# Patient Record
Sex: Male | Born: 1965 | Race: Black or African American | Hispanic: No | Marital: Married | Smoking: Never smoker
Health system: Southern US, Community
[De-identification: ages and names within clinical notes are randomized; demographics above are authoritative.]

## PROBLEM LIST (undated history)

## (undated) DIAGNOSIS — R7303 Prediabetes: Secondary | ICD-10-CM

## (undated) DIAGNOSIS — I10 Essential (primary) hypertension: Secondary | ICD-10-CM

## (undated) DIAGNOSIS — M1990 Unspecified osteoarthritis, unspecified site: Secondary | ICD-10-CM

---

## 2004-08-14 ENCOUNTER — Ambulatory Visit (HOSPITAL_COMMUNITY): Admission: RE | Admit: 2004-08-14 | Discharge: 2004-08-14 | Payer: Self-pay | Admitting: Gastroenterology

## 2010-12-07 ENCOUNTER — Encounter
Admission: RE | Admit: 2010-12-07 | Discharge: 2010-12-07 | Payer: Self-pay | Source: Home / Self Care | Attending: Family Medicine | Admitting: Family Medicine

## 2011-05-14 NOTE — Op Note (Signed)
NAME:  Daryl Chen, Daryl Chen                         ACCOUNT NO.:  [id number]   MEDICAL RECORD NO.:  [id number]                   PATIENT TYPE:  AMB   LOCATION:  ENDO                                 FACILITY:  [facility]   PHYSICIAN:  Graylin Shiver, M.D.                DATE OF BIRTH:  04/12/1966   DATE OF PROCEDURE:  08/14/2004  DATE OF DISCHARGE:                                 OPERATIVE REPORT   PROCEDURE:  Colonoscopy.   INDICATIONS FOR PROCEDURE:  Rectal bleeding.   CONSENT:  Informed consent was obtained after explanation of the risks of  bleeding, infection, and perforation.   PREMEDICATION:  Fentanyl 100 mcg IV, Versed 12.5 mg IV.   PROCEDURE IN DETAIL:  With the patient in the left lateral decubitus  position, a rectal exam was performed and no masses were felt.  The Olympus  colonoscope was inserted into the rectum and advanced around the colon to  the cecum.  Cecal landmarks were identified.  The cecum and ascending colon  were normal.  The transverse colon was normal.  The descending colon,  sigmoid, and rectum were normal.  The scope was retroflexed in the rectum,  no abnormalities wejre seen.  The scope was straightened and brought out.  He tolerated the procedure well without complications.   IMPRESSION:  Normal colonoscopy to the cecum.                                               Graylin Shiver, M.D.    Germain Osgood  D:  08/14/2004  T:  08/15/2004  Job:  [id number]   cc:   Annabell Howells, MD  Fax: [phone]

## 2013-10-08 ENCOUNTER — Ambulatory Visit: Payer: 59 | Admitting: Emergency Medicine

## 2013-10-08 VITALS — BP 126/78 | HR 80 | Temp 99.9°F | Resp 18 | Ht 66.6 in | Wt 189.8 lb

## 2013-10-08 DIAGNOSIS — Z202 Contact with and (suspected) exposure to infections with a predominantly sexual mode of transmission: Secondary | ICD-10-CM

## 2013-10-08 DIAGNOSIS — Z9189 Other specified personal risk factors, not elsewhere classified: Secondary | ICD-10-CM

## 2013-10-08 MED ORDER — VALACYCLOVIR HCL 1 G PO TABS: 1000.0000 mg | ORAL_TABLET | Freq: Two times a day (BID) | ORAL | Status: DC

## 2013-10-08 NOTE — Patient Instructions (Signed)
Genital Herpes  Genital herpes is a sexually transmitted disease. This means that it is a disease passed by having sex with an infected person. There is no cure for genital herpes. The time between attacks can be months to years. The virus may live in a person but produce no problems (symptoms). This infection can be passed to a baby as it travels down the birth canal (vagina). In a newborn, this can cause central nervous system damage, eye damage, or even death. The virus that causes genital herpes is usually HSV-2 virus. The virus that causes oral herpes is usually HSV-1. The diagnosis (learning what is wrong) is made through culture results.  SYMPTOMS   Usually symptoms of pain and itching begin a few days to a week after contact. It first appears as small blisters that progress to small painful ulcers which then scab over and heal after several days. It affects the outer genitalia, birth canal, cervix, penis, anal area, buttocks, and thighs.  HOME CARE INSTRUCTIONS   · Keep ulcerated areas dry and clean.  · Take medications as directed. Antiviral medications can speed up healing. They will not prevent recurrences or cure this infection. These medications can also be taken for suppression if there are frequent recurrences.  · While the infection is active, it is contagious. Avoid all sexual contact during active infections.  · Condoms may help prevent spread of the herpes virus.  · Practice safe sex.  · Wash your hands thoroughly after touching the genital area.  · Avoid touching your eyes after touching your genital area.  · Inform your caregiver if you have had genital herpes and become pregnant. It is your responsibility to insure a safe outcome for your baby in this pregnancy.  · Only take over-the-counter or prescription medicines for pain, discomfort, or fever as directed by your caregiver.  SEEK MEDICAL CARE IF:   · You have a recurrence of this infection.  · You do not respond to medications and are not  improving.  · You have new sources of pain or discharge which have changed from the original infection.  · You have an oral temperature above 102° F (38.9° C).  · You develop abdominal pain.  · You develop eye pain or signs of eye infection.  Document Released: 12/10/2000 Document Revised: 03/06/2012 Document Reviewed: 12/31/2009  ExitCare® Patient Information ©2014 [employer].

## 2013-10-09 NOTE — Progress Notes (Signed)
[facility] [address], [place] [address] [phone]  Date:  10/08/2013   Name:  Daryl Chen   DOB:  09-Apr-1966   MRN:  [id number]  PCP:  No primary provider on file.    Chief Complaint: Exposure to STD   History of Present Illness:  Daryl Chen is a 47 y.o. 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  He is asymptomatic and has been in what he presumed was a monogamous relationship for 5 years.  He has no history of STD.  No improvement with over the counter medications or other home remedies. Denies other complaint or health concern today.   There are no active problems to display for this patient.   No past medical history on file.  No past surgical history on file.  History  Substance Use Topics  . Smoking status: Never Smoker   . Smokeless tobacco: Never Used  . Alcohol Use: Yes     Comment:  heavy        No family history on file.  No Known Allergies  Medication list has been reviewed and updated.  No current outpatient prescriptions on file prior to visit.   No current facility-administered medications on file prior to visit.    Review of Systems:  As per HPI, otherwise negative.    Physical Examination: Filed Vitals:   10/08/13 1810  BP: 126/78  Pulse: 80  Temp: 99.9 F (37.7 C)  Resp: 18   Filed Vitals:   10/08/13 1810  Height: 5' 6.6" (1.692 m)  Weight: 189 lb 12.8 oz (86.093 kg)   Body mass index is 30.07 kg/(m^2). Ideal Body Weight: Weight in (lb) to have BMI = 25: 157.4   GEN: WDWN, NAD, Non-toxic, Alert & Oriented x 3 HEENT: Atraumatic, Normocephalic.  Ears and Nose: No external deformity. EXTR: No clubbing/cyanosis/edema NEURO: Normal gait.  PSYCH: Normally interactive. Conversant. Not depressed or anxious appearing.  Calm demeanor.    Assessment and Plan: Exposure to STD Counseled regarding  STD Offered RX with valtrex    Signed,  Phillips Odor, MD

## 2015-12-05 ENCOUNTER — Emergency Department (HOSPITAL_COMMUNITY)
Admission: EM | Admit: 2015-12-05 | Discharge: 2015-12-05 | Disposition: A | Discharge status: Home / Self Care | Payer: 59 | Attending: Emergency Medicine | Admitting: Emergency Medicine

## 2015-12-05 ENCOUNTER — Encounter (HOSPITAL_COMMUNITY): Payer: Self-pay | Admitting: Emergency Medicine

## 2015-12-05 ENCOUNTER — Emergency Department (HOSPITAL_COMMUNITY): Payer: 59

## 2015-12-05 DIAGNOSIS — M4802 Spinal stenosis, cervical region: Secondary | ICD-10-CM | POA: Diagnosis not present

## 2015-12-05 DIAGNOSIS — I10 Essential (primary) hypertension: Secondary | ICD-10-CM | POA: Diagnosis not present

## 2015-12-05 DIAGNOSIS — Z79899 Other long term (current) drug therapy: Secondary | ICD-10-CM | POA: Diagnosis not present

## 2015-12-05 DIAGNOSIS — R200 Anesthesia of skin: Secondary | ICD-10-CM | POA: Diagnosis not present

## 2015-12-05 DIAGNOSIS — G629 Polyneuropathy, unspecified: Secondary | ICD-10-CM | POA: Insufficient documentation

## 2015-12-05 DIAGNOSIS — R29898 Other symptoms and signs involving the musculoskeletal system: Secondary | ICD-10-CM | POA: Diagnosis not present

## 2015-12-05 DIAGNOSIS — G542 Cervical root disorders, not elsewhere classified: Secondary | ICD-10-CM

## 2015-12-05 DIAGNOSIS — R202 Paresthesia of skin: Secondary | ICD-10-CM | POA: Diagnosis present

## 2015-12-05 HISTORY — DX: Essential (primary) hypertension: I10

## 2015-12-05 LAB — DIFFERENTIAL
BASOS PCT: 0 %
Basophils Absolute: 0 10*3/uL (ref 0.0–0.1)
EOS PCT: 0 %
Eosinophils Absolute: 0 10*3/uL (ref 0.0–0.7)
LYMPHS PCT: 31 %
Lymphs Abs: 1.3 10*3/uL (ref 0.7–4.0)
MONO ABS: 0.3 10*3/uL (ref 0.1–1.0)
Monocytes Relative: 8 %
NEUTROS PCT: 61 %
Neutro Abs: 2.5 10*3/uL (ref 1.7–7.7)

## 2015-12-05 LAB — CBC
HCT: 43.2 % (ref 39.0–52.0)
Hemoglobin: 14.6 g/dL (ref 13.0–17.0)
MCH: 30.2 pg (ref 26.0–34.0)
MCHC: 33.8 g/dL (ref 30.0–36.0)
MCV: 89.3 fL (ref 78.0–100.0)
PLATELETS: 203 10*3/uL (ref 150–400)
RBC: 4.84 MIL/uL (ref 4.22–5.81)
RDW: 12.4 % (ref 11.5–15.5)
WBC: 4.2 10*3/uL (ref 4.0–10.5)

## 2015-12-05 LAB — I-STAT CHEM 8, ED
BUN: 10 mg/dL (ref 6–20)
CALCIUM ION: 1.16 mmol/L (ref 1.12–1.23)
CHLORIDE: 105 mmol/L (ref 101–111)
Creatinine, Ser: 1 mg/dL (ref 0.61–1.24)
Glucose, Bld: 148 mg/dL — ABNORMAL HIGH (ref 65–99)
HCT: 49 % (ref 39.0–52.0)
Hemoglobin: 16.7 g/dL (ref 13.0–17.0)
POTASSIUM: 4.9 mmol/L (ref 3.5–5.1)
SODIUM: 142 mmol/L (ref 135–145)
TCO2: 27 mmol/L (ref 0–100)

## 2015-12-05 LAB — I-STAT TROPONIN, ED: Troponin i, poc: 0 ng/mL (ref 0.00–0.08)

## 2015-12-05 LAB — COMPREHENSIVE METABOLIC PANEL
ALBUMIN: 4.5 g/dL (ref 3.5–5.0)
ALK PHOS: 49 U/L (ref 38–126)
ALT: 22 U/L (ref 17–63)
ANION GAP: 10 (ref 5–15)
AST: 30 U/L (ref 15–41)
BUN: 8 mg/dL (ref 6–20)
CALCIUM: 9.6 mg/dL (ref 8.9–10.3)
CHLORIDE: 105 mmol/L (ref 101–111)
CO2: 25 mmol/L (ref 22–32)
CREATININE: 1.03 mg/dL (ref 0.61–1.24)
GFR calc non Af Amer: >60 mL/min (ref >60)
GLUCOSE: 143 mg/dL — AB (ref 65–99)
Potassium: 4.3 mmol/L (ref 3.5–5.1)
SODIUM: 140 mmol/L (ref 135–145)
Total Bilirubin: 0.8 mg/dL (ref 0.3–1.2)
Total Protein: 8.3 g/dL — ABNORMAL HIGH (ref 6.5–8.1)

## 2015-12-05 LAB — ETHANOL

## 2015-12-05 LAB — FOLATE: FOLATE: 9.8 ng/mL (ref >5.9)

## 2015-12-05 LAB — PROTIME-INR
INR: 1.07 (ref 0.00–1.49)
PROTHROMBIN TIME: 14.1 s (ref 11.6–15.2)

## 2015-12-05 LAB — APTT: aPTT: 31 seconds (ref 24–37)

## 2015-12-05 LAB — VITAMIN B12: Vitamin B-12: 412 pg/mL (ref 180–914)

## 2015-12-05 MED ORDER — PREDNISONE 20 MG PO TABS
60.0000 mg | ORAL_TABLET | Freq: Once | ORAL | Status: AC
  Administered 2015-12-05: 60 mg via ORAL
  Filled 2015-12-05: qty 3

## 2015-12-05 MED ORDER — METHYLPREDNISOLONE 4 MG PO TBPK: ORAL_TABLET | ORAL | Status: DC

## 2015-12-05 NOTE — ED Notes (Signed)
Patient transported to MRI 

## 2015-12-05 NOTE — ED Provider Notes (Signed)
CSN: [id number]     Arrival date & time 12/05/15  1629 History   First MD Initiated Contact with Patient 12/05/15 1731     Chief Complaint  Patient presents with  . Stroke Symptoms   HPI Pt has had trouble over 2-3 months now.  He noticed difficulty moving his index and thumb on the left side.  He has trouble extending his left index finger and thumb.  It feels numb and tingling.  He has now noticed trouble with sx moving to the right arm.    No trouble with speech or weakness or numbness in the legs.  No vision issues. He called his doctor and was told to come to the ED.  Past Medical History  Diagnosis Date  . Hypertension   He is supposed to be on medications. History reviewed. No pertinent past surgical history. No family history on file. Social History  Substance Use Topics  . Smoking status: Never Smoker   . Smokeless tobacco: Never Used  . Alcohol Use: Yes     Comment:  heavy        Review of Systems  All other systems reviewed and are negative.     Allergies  Review of patient's allergies indicates no known allergies.  Home Medications   Prior to Admission medications   Medication Sig Start Date End Date Taking? Authorizing Provider  valACYclovir (VALTREX) 1000 MG tablet Take 1 tablet (1,000 mg total) by mouth 2 (two) times daily. 10/08/13   Carmelina Dane, MD   BP 181/102 mmHg  Pulse 74  Temp(Src) 98.2 F (36.8 C) (Oral)  Resp 18  SpO2 100% Physical Exam  Constitutional: He is oriented to person, place, and time. He appears well-developed and well-nourished. No distress.  HENT:  Head: Normocephalic and atraumatic.  Right Ear: External ear normal.  Left Ear: External ear normal.  Mouth/Throat: Oropharynx is clear and moist.  Eyes: Conjunctivae are normal. Right eye exhibits no discharge. Left eye exhibits no discharge. No scleral icterus.  Neck: Neck supple. No tracheal deviation present.  Cardiovascular: Normal rate, regular rhythm and intact distal  pulses.   Pulmonary/Chest: Effort normal and breath sounds normal. No stridor. No respiratory distress. He has no wheezes. He has no rales.  Abdominal: Soft. Bowel sounds are normal. He exhibits no distension. There is no tenderness. There is no rebound and no guarding.  Musculoskeletal: He exhibits no edema or tenderness.  Neurological: He is alert and oriented to person, place, and time. He has normal strength. No cranial nerve deficit (No facial droop, extraocular movements intact, tongue midline ) or sensory deficit. He exhibits normal muscle tone. He displays no seizure activity. Coordination normal.  No pronator drift bilateral upper extrem, able to hold both legs off bed for 5 seconds, sensation intact in all extremities, no visual field cuts, no left or right sided neglect, normal finger-nose exam bilaterally, no nystagmus noted  Unable  extending thumb and index finger on left, atrophy noted in forearm and  Possibly hand, tremor noted in bilateral lower extremity when lifting off the bed   Skin: Skin is warm and dry. No rash noted.  Psychiatric: He has a normal mood and affect.  Nursing note and vitals reviewed.   ED Course  Procedures (including critical care time) Labs Review Labs Reviewed  I-STAT CHEM 8, ED - Abnormal; Notable for the following:    Glucose, Bld 148 (*)    All other components within normal limits  PROTIME-INR  APTT  CBC  DIFFERENTIAL  COMPREHENSIVE METABOLIC PANEL  I-STAT TROPOININ, ED  CBG MONITORING, ED    Imaging Review Mr Brain Wo Contrast  12/05/2015  CLINICAL DATA:  49 year old male with 3 month history of progressive weakness and paresthesias. Lower extremity tremor. Initial encounter. EXAM: MRI HEAD WITHOUT CONTRAST TECHNIQUE: Multiplanar, multiecho pulse sequences of the brain and surrounding structures were obtained without intravenous contrast. COMPARISON:  Cervical spine MRI from today reported separately. FINDINGS: Cerebral volume is normal.  No restricted diffusion to suggest acute infarction. No midline shift, mass effect, evidence of mass lesion, ventriculomegaly, extra-axial collection or acute intracranial hemorrhage. Cervicomedullary junction and pituitary are within normal limits. Major intracranial vascular flow voids are within normal limits, dominant right vertebral artery re- demonstrated. Largely normal for age gray and white matter signal throughout the brain. There is a solitary area of chronic micro hemorrhage in the right parietal lobe on series 7, image 61). No encephalomalacia. No other chronic cerebral blood products. Visible internal auditory structures appear normal. Mastoids are clear. Paranasal sinuses are clear. Negative orbit and scalp soft tissues. Normal bone marrow signal. Cervical spine findings today reported separately. IMPRESSION: No acute intracranial abnormality and largely unremarkable for age noncontrast MRI appearance of the brain. Electronically Signed   By: Odessa Fleming M.D.   On: 12/05/2015 21:07   Mr Cervical Spine Wo Contrast  12/05/2015  CLINICAL DATA:  49 year old male with 3 months probe aggressive weakness and paresthesia right index finger and thumb. Weakness and atrophy of left upper extremity. Initial encounter. EXAM: MRI CERVICAL SPINE WITHOUT CONTRAST TECHNIQUE: Multiplanar, multisequence MR imaging of the cervical spine was performed. No intravenous contrast was administered. COMPARISON:  None. FINDINGS: Study is intermittently degraded by motion artifact despite repeated imaging attempts. Straightening of cervical lordosis. Chronic degenerative endplate changes with marrow changes at C6-C7. No marrow edema or evidence of acute osseous abnormality. Cervicomedullary junction is within normal limits. Grossly negative visualized brain parenchyma. Despite multilevel spinal stenosis, no spinal cord signal abnormality is identified. Dominant right vertebral artery. C2-C3:  Negative. C3-C4: Uncovertebral  hypertrophy greater on the right. Mild to moderate right C4 foraminal stenosis. C4-C5: Mild circumferential disc bulge. Broad-based posterior component of disc. No significant spinal stenosis. Disc and uncovertebral hypertrophy resulting in mild to moderate bilateral C5 foraminal stenosis. C5-C6: Circumferential disc bulge plus superimposed central disc protrusion (Series 7, image 17). Spinal stenosis with ventral spinal cord mass effect. No cord signal abnormality. Mild if any left C6 foraminal stenosis. C6-C7: Disc space loss with circumferential disc osteophyte complex. Broad-based left eccentric posterior component of disc best seen on series 6, image 25. Mild ligament flavum hypertrophy. Borderline to mild spinal stenosis with no cord mass effect. Uncovertebral hypertrophy with severe left greater than right C7 foraminal stenosis. C7-T1: Circumferential disc osteophyte complex with broad-based posterior component of disc best seen on series 6, image 30. Mild ligament flavum hypertrophy. Mild spinal stenosis with mild if any cord mass effect. Disc and uncovertebral hypertrophy with severe bilateral C8 foraminal stenosis. No upper thoracic spinal stenosis identified. There is a small T2-T3 disc protrusion. IMPRESSION: 1. Degenerative spinal stenosis with up to mild spinal cord mass effect at C5-C6, C6-C7, and C7-T1. No spinal cord signal abnormality. 2. Degenerative severe neural foraminal stenosis at the bilateral C7 (greater on the left) and C8 nerve levels. Electronically Signed   By: Odessa Fleming M.D.   On: 12/05/2015 20:44       MDM   Final diagnoses:  Cervical neuropathy  Spinal stenosis  of cervical region  Foraminal stenosis of cervical region    1826  Discussed with Dr Amada JupiterKirkpatrick.  Will proceed with MRI of brain and c spine  Reviewed findings with Dr Hosie PoissonSumner.  MRI of c spine correlates with his exam.  Will consult with neurosurgery.  I discussed the case with Dr Jordan LikesPool.  Pt will likely end up  needing surgery but not emergently.  Start on steroid dose pack.  Follow up in his office.  DIscussed findings with patient and importance of follow up plan   Linwood DibblesJon Galen Russman, MD 12/05/15 2327

## 2015-12-05 NOTE — Discharge Instructions (Signed)

## 2015-12-05 NOTE — ED Notes (Signed)
Per pt, he lost the ability to move pointer and thumb fingers on his L hand,  Pt c/o numbness to left side of his face, and tingling to L arm. Pt states hes had these symptoms for two months and never went ot go see a doctor. Pt is ambulatory with steady gait. Grip strength is equal, no drift and face is symmetrical. Denies pain at this time.

## 2015-12-05 NOTE — Consult Note (Signed)
Consult Reason for Consult:weakness and numbness Referring Physician: Dr Lynelle DoctorKnapp ED  CC: weakness and numbness  HPI: Daryl Chen is an [age] y.o. male with hx of HTN presenting with a 3 month history of progressive weakness and paresthesias of his right index finger and thumb. He also describes a paresthesia type sensation radiating up his right arm. Recently has also noted similar symptoms starting on the LUE. He notes atrophy going up his right forearm. Denies any weakness or numbness in his lower extremities though he does report that his legs "shake" when he tries to use them. Denies any falls, no trauma to head or neck. No change in bowel/bladder. No family history of neurodegenerative or muscle disorders.   Past Medical History  Diagnosis Date  . Hypertension     History reviewed. No pertinent past surgical history.  No family history on file.  Social History:  reports that he has never smoked. He has never used smokeless tobacco. He reports that he drinks alcohol. He reports that he does not use illicit drugs.  No Known Allergies  Medications: I have reviewed the patient's current medications.   ROS: Out of a complete 14 system review, the patient complains of only the following symptoms, and all other reviewed systems are negative. +weakness, atrophy, numbness  Physical Examination: Filed Vitals:   12/05/15 1915 12/05/15 1930  BP: 135/90 131/83  Pulse: 63 64  Temp:    Resp: 20 21   Physical Exam  Constitutional: He appears well-developed and well-nourished.  Psych: Affect appropriate to situation Eyes: No scleral injection HENT: No OP obstrucion Head: Normocephalic.  Cardiovascular: Normal rate and regular rhythm.  Respiratory: Effort normal and breath sounds normal.  GI: Soft. Bowel sounds are normal. No distension. There is no tenderness.  Skin: WDI  Neurologic Examination Mental Status: Alert, oriented, thought content appropriate.  Speech fluent  without evidence of aphasia.  Able to follow 3 step commands without difficulty. Cranial Nerves: II: funduscopic exam wnl bilaterally, visual fields grossly normal, pupils equal, round, reactive to light and accommodation III,IV, VI: ptosis not present, extra-ocular motions intact bilaterally V,VII: smile symmetric, facial light touch sensation normal bilaterally VIII: hearing normal bilaterally IX,X: gag reflex present XI: trapezius strength/neck flexion strength normal bilaterally XII: tongue strength normal  Motor: LUE and LLE 5/5 strength Proximal and Distal RUE 5/5 strength Proximal LUE 5/5, biceps 5/5, triceps 5/5, brachioradialis 5/5, wrist ext/flex 5/5, unable to extend thumb or index finger. Weak thumb opposition on left side.  Atrophy noted in right forearm. Spastic tremor type movement of bilateral LE when attempting to lift feet off the floor Sensory: diminished LT and PP on palmar surface of thumb and index finger on the left Deep Tendon Reflexes: *+ and symmetric throughout Plantars: Right: downgoing   Left: downgoing Cerebellar: normal finger-to-nose,  and normal heel-to-shin test Gait: deferred  Laboratory Studies:   Basic Metabolic Panel:  Recent Labs Lab 12/05/15 1659 12/05/15 1744  NA 142 140  K 4.9 4.3  CL 105 105  CO2  --  25  GLUCOSE 148* 143*  BUN 10 8  CREATININE 1.00 1.03  CALCIUM  --  9.6    Liver Function Tests:  Recent Labs Lab 12/05/15 1744  AST 30  ALT 22  ALKPHOS 49  BILITOT 0.8  PROT 8.3*  ALBUMIN 4.5   No results for input(s): LIPASE, AMYLASE in the last 168 hours. No results for input(s): AMMONIA in the last 168 hours.  CBC:  Recent Labs Lab  12/05/15 1659 12/05/15 1744  WBC  --  4.2  NEUTROABS  --  2.5  HGB 16.7 14.6  HCT 49.0 43.2  MCV  --  89.3  PLT  --  203    Cardiac Enzymes: No results for input(s): CKTOTAL, CKMB, CKMBINDEX, TROPONINI in the last 168 hours.  BNP: Invalid input(s): POCBNP  CBG: No results  for input(s): GLUCAP in the last 168 hours.  Microbiology: No results found for this or any previous visit.  Coagulation Studies:  Recent Labs  12/05/15 1744  LABPROT 14.1  INR 1.07    Urinalysis: No results for input(s): COLORURINE, LABSPEC, PHURINE, GLUCOSEU, HGBUR, BILIRUBINUR, KETONESUR, PROTEINUR, UROBILINOGEN, NITRITE, LEUKOCYTESUR in the last 168 hours.  Invalid input(s): APPERANCEUR  Lipid Panel:  No results found for: CHOL, TRIG, HDL, CHOLHDL, VLDL, LDLCALC  HgbA1C: No results found for: HGBA1C  Urine Drug Screen:  No results found for: LABOPIA, COCAINSCRNUR, LABBENZ, AMPHETMU, THCU, LABBARB  Alcohol Level:  Recent Labs Lab 12/05/15 1748  ETH <5    Other results:  Imaging: No results found.   Assessment/Plan:  49y/o gentleman with history of hypertension presenting with 3 month history of progressive weakness and sensory changes involving the LUE distal > proximal. Symptoms now starting to involve his RUE. Differential includes a myelopathy vs myopathy vs peripheral neuropathy.  -check MRI brain and C spine -if unremarkable will need outpatient neurology follow up for an EMG/NCS   Elspeth Cho, DO [facility] [phone]  If 7pm- 7am, please page neurology on call as listed in AMION. 12/05/2015, 8:09 PM

## 2015-12-10 LAB — VITAMIN B1: Vitamin B1 (Thiamine): 104.8 nmol/L (ref 66.5–200.0)

## 2017-04-28 IMAGING — MR MR HEAD W/O CM
9 of 11 series · 35 of 48 positions shown · non-contrast
Comparison: Cervical spine MRI from today reported separately.

CLINICAL DATA: 49-year-old male with 3 month history of progressive
weakness and paresthesias. Lower extremity tremor. Initial
encounter.

EXAM:
MRI HEAD WITHOUT CONTRAST
TECHNIQUE: Multiplanar, multiecho pulse sequences of the brain and surrounding
structures were obtained without intravenous contrast.

[Series 3: DWI · axial · 3.6mm · 0.94mm/px · z∈[-42,+105]mm · 9 of 86 slices shown (1 of 4)]
[im 1/86]
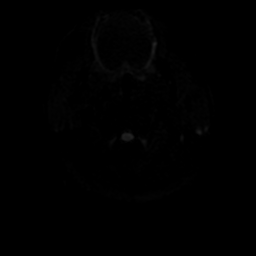
[im 11/86]
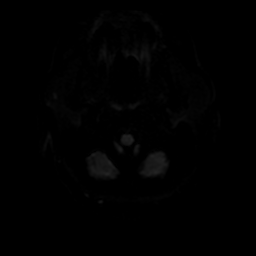
[im 22/86]
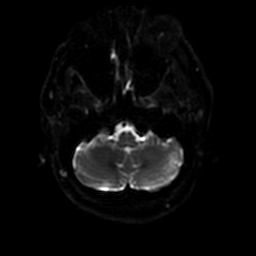
[im 32/86]
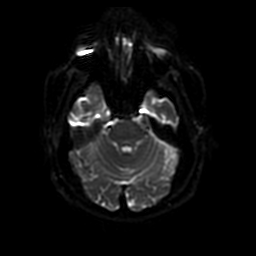
[im 43/86]
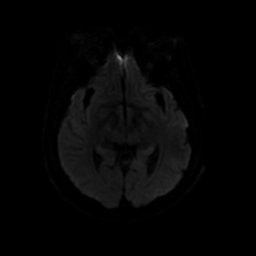
[im 54/86]
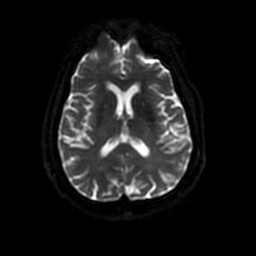
[im 64/86]
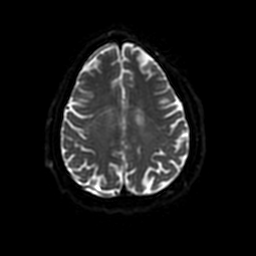
[im 75/86]
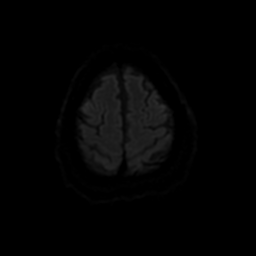
[im 86/86]
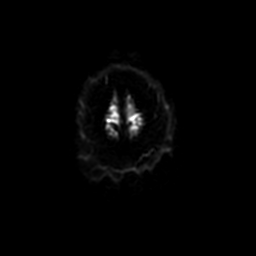

[Series 4: FLAIR · sagittal · 5.0mm · 0.47mm/px · 2 of 23 slices shown (1 of 2)]
[im 1/23]
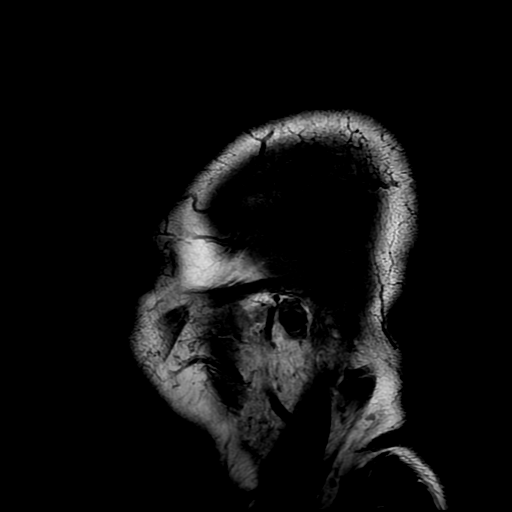
[im 23/23]
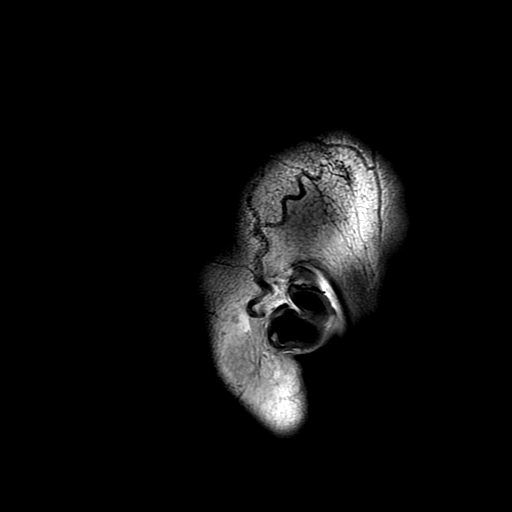

[Series 5: T2 · axial · 5.0mm · 0.47mm/px · z∈[-36,+97]mm · 2 of 24 slices shown (1 of 2)]
[im 1/24]
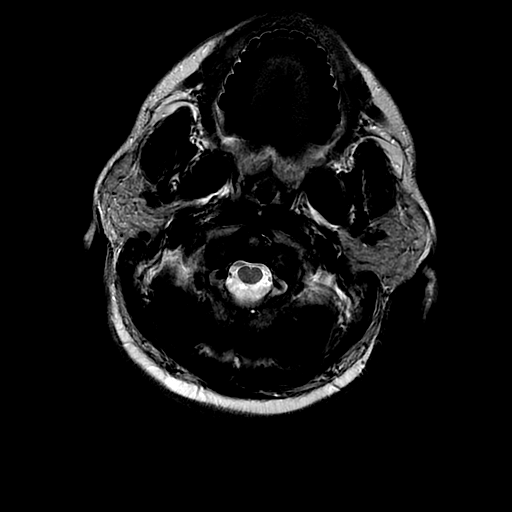
[im 24/24]
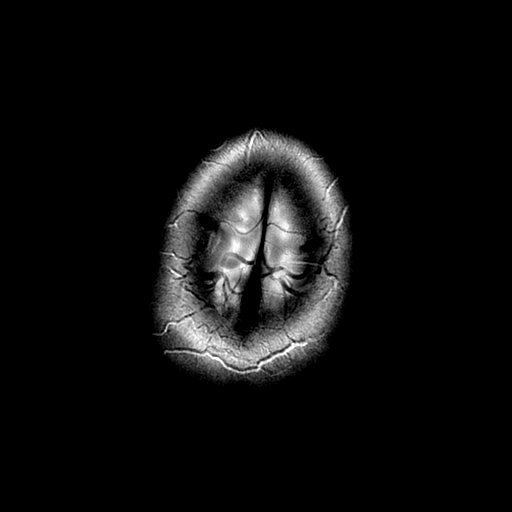

[Series 6: FLAIR · axial · 5.0mm · 0.47mm/px · z∈[-36,+97]mm · 2 of 24 slices shown (2 of 2)]
[im 1/24]
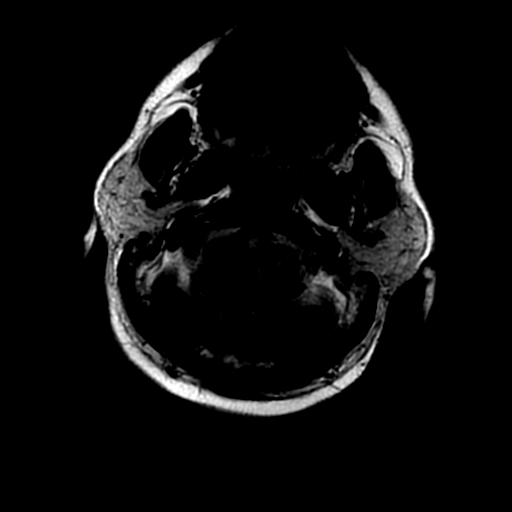
[im 24/24]
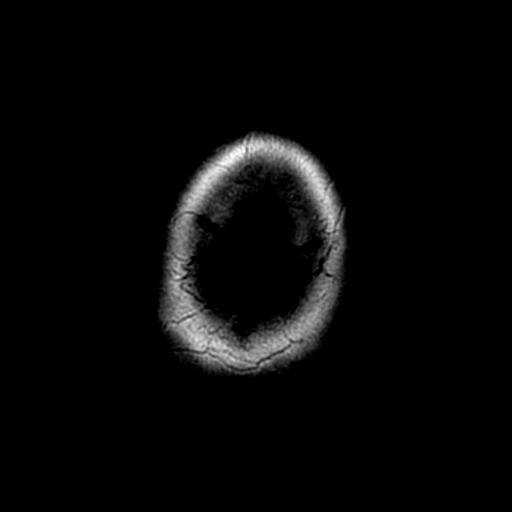

[Series 7: (person_name) · axial · 3.0mm · 0.47mm/px · z∈[-38,-4]mm · 3 of 96 slices shown]
[im 1/96]
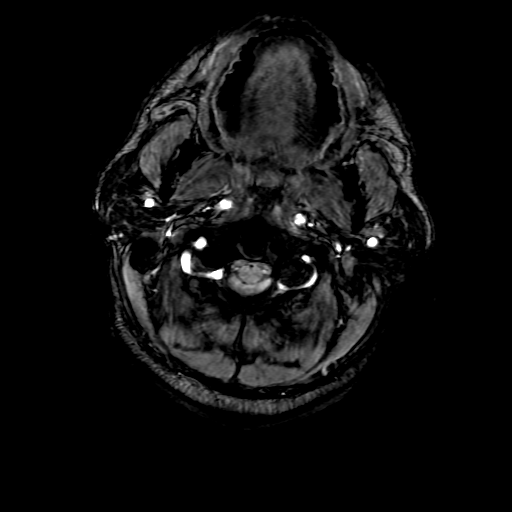
[im 12/96]
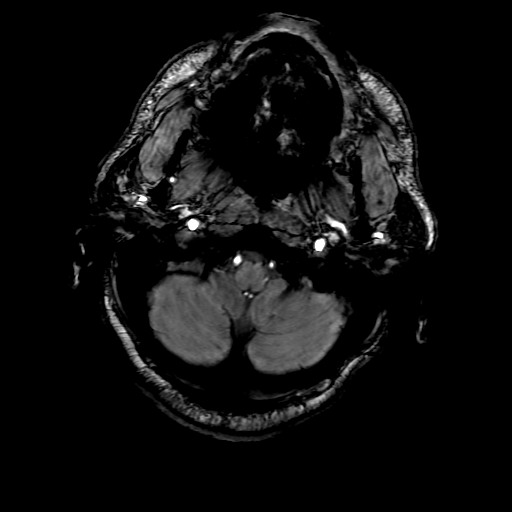
[im 24/96]
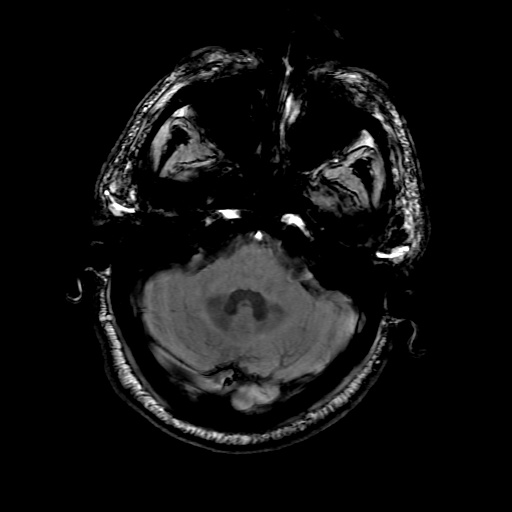

[Series 9: T2 · coronal · 5.0mm · 0.47mm/px · 3 of 27 slices shown (2 of 2)]
[im 1/27]
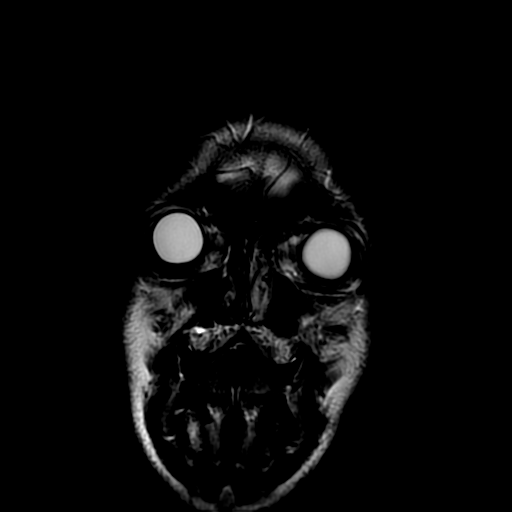
[im 14/27]
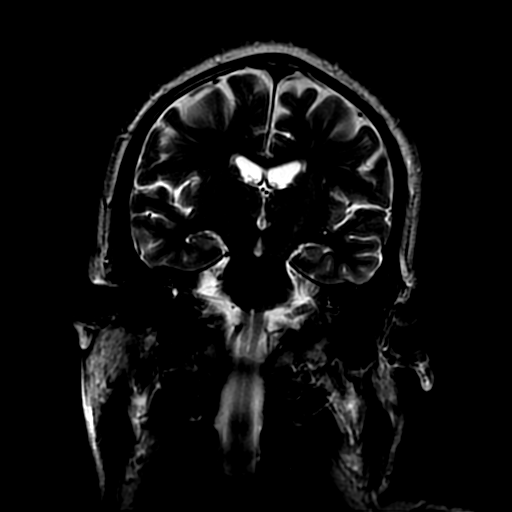
[im 27/27]
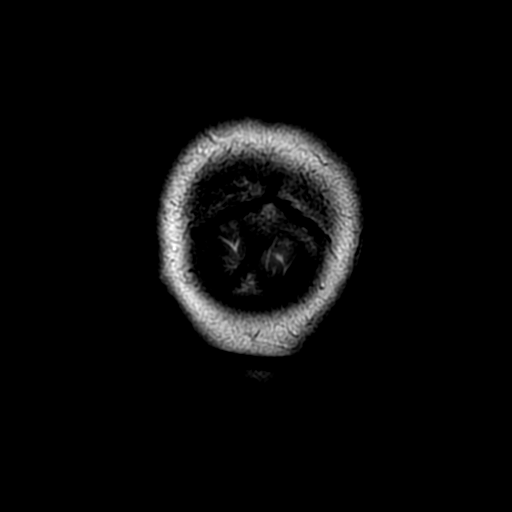

[Series 10: DWI · coronal · 5.0mm · 0.94mm/px · 7 of 72 slices shown (2 of 4)]
[im 1/72]
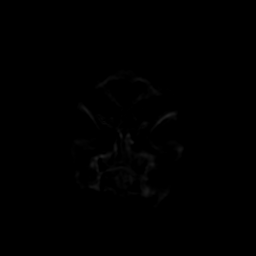
[im 12/72]
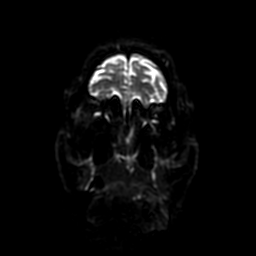
[im 24/72]
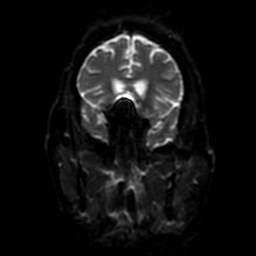
[im 36/72]
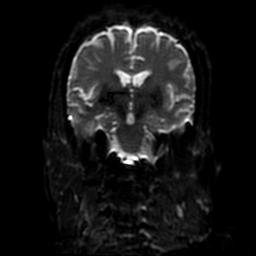
[im 48/72]
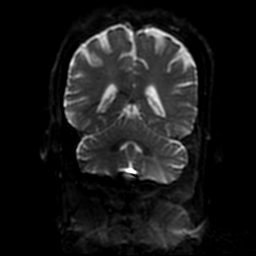
[im 60/72]
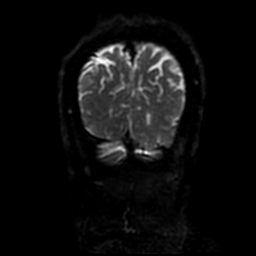
[im 72/72]
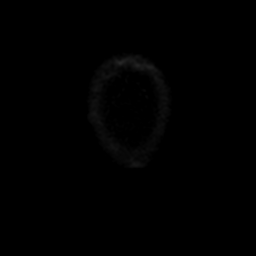

[Series 300: DWI · axial · 3.6mm · 0.94mm/px · z∈[-42,+105]mm · 4 of 43 slices shown (3 of 4)]
[im 1/43]
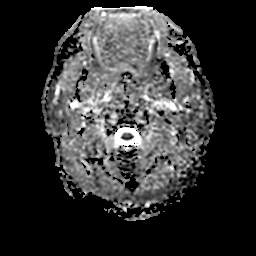
[im 15/43]
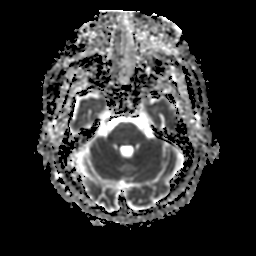
[im 29/43]
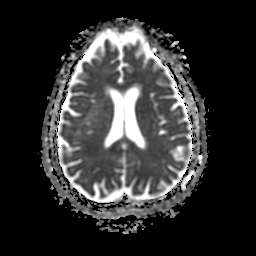
[im 43/43]
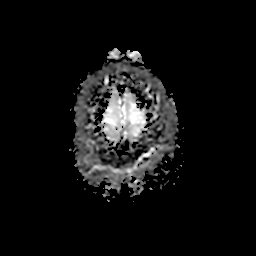

[Series 1000: DWI · coronal · 5.0mm · 0.94mm/px · 3 of 36 slices shown (4 of 4)]
[im 1/36]
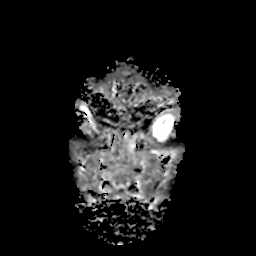
[im 18/36]
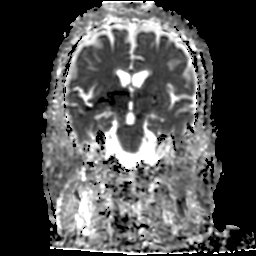
[im 36/36]
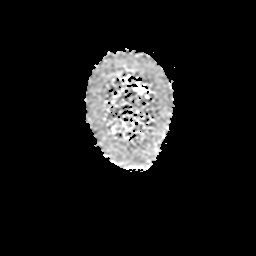

[35 of 48 positions shown; findings below may reference images not displayed]

FINDINGS: Cerebral volume is normal. No restricted diffusion to suggest acute
infarction. No midline shift, mass effect, evidence of mass lesion,
ventriculomegaly, extra-axial collection or acute intracranial
hemorrhage. Cervicomedullary junction and pituitary are within
normal limits. Major intracranial vascular flow voids are within
normal limits, dominant right vertebral artery re- demonstrated.

Largely normal for age gray and white matter signal throughout the
brain. There is a solitary area of chronic micro hemorrhage in the
right parietal lobe on series 7, image 61). No encephalomalacia. No
other chronic cerebral blood products.

Visible internal auditory structures appear normal. Mastoids are
clear. Paranasal sinuses are clear. Negative orbit and scalp soft
tissues. Normal bone marrow signal. Cervical spine findings today
reported separately.
IMPRESSION: No acute intracranial abnormality and largely unremarkable for age
noncontrast MRI appearance of the brain.

## 2019-07-06 ENCOUNTER — Other Ambulatory Visit: Payer: Self-pay | Admitting: Orthopedic Surgery

## 2019-07-19 DIAGNOSIS — M1612 Unilateral primary osteoarthritis, left hip: Secondary | ICD-10-CM | POA: Diagnosis present

## 2019-07-19 NOTE — H&P (Signed)
TOTAL HIP ADMISSION H&P  Patient is admitted for left total hip arthroplasty.  Subjective:  Chief Complaint: left hip pain  HPI: Daryl Chen, 53 y.o. male, has a history of pain and functional disability in the left hip(s) due to arthritis and patient has failed non-surgical conservative treatments for greater than 12 weeks to include NSAID's and/or analgesics and activity modification.  Onset of symptoms was gradual starting 1 years ago with rapidlly worsening course since that time.The patient noted no past surgery on the left hip(s).  Patient currently rates pain in the left hip at 10 out of 10 with activity. Patient has night pain, worsening of pain with activity and weight bearing, trendelenberg gait, pain that interfers with activities of daily living and pain with passive range of motion. Patient has evidence of subchondral cysts, periarticular osteophytes and joint space narrowing by imaging studies. This condition presents safety issues increasing the risk of falls.   There is no current active infection.  Patient Active Problem List   Diagnosis Date Noted  . Osteoarthritis of left hip 07/19/2019   Past Medical History:  Diagnosis Date  . Hypertension     No past surgical history on file.  No current facility-administered medications for this encounter.    Current Outpatient Medications  Medication Sig Dispense Refill Last Dose  . diclofenac sodium (VOLTAREN) 1 % GEL Apply 2 g topically 4 (four) times daily as needed (pain).     . Garlic 1000 MG CAPS Take 1,000 mg by mouth daily.     . L-ARGININE-500 PO Take 500 mg elemental calcium/kg/hr by mouth daily.     . Omega-3 Fatty Acids (FISH OIL PO) Take 2 capsules by mouth daily.       No Known Allergies  Social History   Tobacco Use  . Smoking status: Never Smoker  . Smokeless tobacco: Never Used  Substance Use Topics  . Alcohol use: Yes    Comment:  heavy        No family history on file.   Review of Systems   Constitutional: Negative.   HENT: Negative.   Eyes: Negative.   Respiratory: Negative.   Cardiovascular:       HTN  Gastrointestinal: Negative.   Genitourinary: Negative.   Musculoskeletal: Positive for joint pain.  Skin: Negative.   Neurological: Negative.   Psychiatric/Behavioral: Negative.     Objective:  Physical Exam  Constitutional: He is oriented to person, place, and time. He appears well-developed and well-nourished.  HENT:  Head: Normocephalic and atraumatic.  Eyes: Pupils are equal, round, and reactive to light.  Neck: Normal range of motion. Neck supple.  Cardiovascular: Intact distal pulses.  Respiratory: Effort normal.  Musculoskeletal:        General: Tenderness present.     Comments: Walks with a significant left-sided greater than right-sided limp any attempts at internal rotation of the left hip caused pain in blocks at -10.  Right hip internal rotation is a 20 with less pain.  Good power to testing of hip flexors abductors abductors extensors and rotators.  Foot tap is negative toes are pink and well perfused.    Neurological: He is alert and oriented to person, place, and time.  Skin: Skin is warm and dry.  Psychiatric: He has a normal mood and affect. His behavior is normal. Judgment and thought content normal.    Vital signs in last 24 hours: BP: ()/()  Arterial Line BP: ()/()   Labs:   Estimated body mass  index is 30.09 kg/m as calculated from the following:   Height as of 10/08/13: 5' 6.6" (1.692 m).   Weight as of 10/08/13: 86.1 kg.   Imaging Review Plain radiographs demonstrate  bone-on-bone arthritic changes with subchondral cysts on the left peripheral osteophytes.  On the right there is still 1 or 2 remaining millimeters of cartilage superiorly there are peripheral osteophytes and smaller subchondral cysts.   Assessment/Plan:  End stage arthritis, left hip(s)  The patient history, physical examination, clinical judgement of the  provider and imaging studies are consistent with end stage degenerative joint disease of the left hip(s) and total hip arthroplasty is deemed medically necessary. The treatment options including medical management, injection therapy, arthroscopy and arthroplasty were discussed at length. The risks and benefits of total hip arthroplasty were presented and reviewed. The risks due to aseptic loosening, infection, stiffness, dislocation/subluxation,  thromboembolic complications and other imponderables were discussed.  The patient acknowledged the explanation, agreed to proceed with the plan and consent was signed. Patient is being admitted for inpatient treatment for surgery, pain control, PT, OT, prophylactic antibiotics, VTE prophylaxis, progressive ambulation and ADL's and discharge planning.The patient is planning to be discharged home with home health services    Patient's anticipated LOS is less than 2 midnights, meeting these requirements: - Younger than 36 - Lives within 1 hour of care - Has a competent adult at home to recover with post-op recover - NO history of  - Chronic pain requiring opiods  - Diabetes  - Coronary Artery Disease  - Heart failure  - Heart attack  - Stroke  - DVT/VTE  - Cardiac arrhythmia  - Respiratory Failure/COPD  - Renal failure  - Anemia  - Advanced Liver disease

## 2019-07-19 NOTE — Patient Instructions (Addendum)
YOU NEED TO HAVE A COVID 19 TEST ON 07-20-2019 AT 900 AM.  THIS TEST MUST BE DONE BEFORE SURGERY, [facility]. ONCE YOUR COVID TEST IS COMPLETED, PLEASE BEGIN THE QUARANTINE INSTRUCTIONS AS OUTLINED IN YOUR HANDOUT.                Daryl BollmanRonnie M Chen     Your procedure is scheduled on: 07-23-2019  [facility]  Report to admitting at 1220 PM   1 VISITOR IS ALLOWED TO WAIT IN WAITING ROOM  ONLY DAY OF YOUR SURGERY.    Call this number if you have problems the morning of surgery [phone]    Remember: BRUSH YOUR TEETH MORNING OF SURGERY AND RINSE YOUR MOUTH OUT, NO CHEWING GUM CANDY OR MINTS.   NO SOLID FOOD AFTER MIDNIGHT THE NIGHT PRIOR TO SURGERY. NOTHING BY MOUTH EXCEPT CLEAR LIQUIDS UNTIL 1150 AM . PLEASE FINISH ENSURE DRINK PER SURGEON ORDER 3 HOURS PRIOR TO SCHEDULED SURGERY TIME WHICH NEEDS TO BE COMPLETED AT  1150 AM.   CLEAR LIQUID DIET   Foods Allowed                                                                     Foods Excluded  Coffee and tea, regular and decaf                             liquids that you cannot  Plain Jell-O any favor except red or purple                                           see through such as: Fruit ices (not with fruit pulp)                                     milk, soups, orange juice  Iced Popsicles                                    All solid food Carbonated beverages, regular and diet                                    Cranberry, grape and apple juices Sports drinks like Gatorade Lightly seasoned clear broth or consume(fat free) Sugar, honey syrup  Sample Menu Breakfast                                Lunch                                     Supper Cranberry juice  Beef broth                            Chicken broth Jell-O                                     Grape juice                           Apple juice Coffee or tea                         Jell-O                                      Popsicle                                                Coffee or tea                        Coffee or tea  _____________________________________________________________________     Take these medicines the morning of surgery with A SIP OF WATER: NONE                               You may not have any metal on your body including hair pins and              piercings  Do not wear jewelry, make-up, lotions, powders or perfumes, deodorant                        Men may shave face and neck.   Do not bring valuables to the hospital. [facility] IS NOT             RESPONSIBLE   FOR VALUABLES.  Contacts, dentures or bridgework may not be worn into surgery.  Leave suitcase in the car. After surgery it may be brought to your room.   _____________________________________________________________________             [facility] Before surgery, you can play an important role.  Because skin is not sterile, your skin needs to be as free of germs as possible.  You can reduce the number of germs on your skin by washing with CHG (chlorahexidine gluconate) soap before surgery.  CHG is an antiseptic cleaner which kills germs and bonds with the skin to continue killing germs even after washing. Please DO NOT use if you have an allergy to CHG or antibacterial soaps.  If your skin becomes reddened/irritated stop using the CHG and inform your nurse when you arrive at Short Stay. Do not shave (including legs and underarms) for at least 48 hours prior to the first CHG shower.  You may shave your face/neck. Please follow these instructions carefully:  1.  Shower with CHG Soap the night before surgery and the  morning of Surgery.  2.  If you choose to wash your hair, wash your hair first as usual with your  normal  shampoo.  3.  After you shampoo, rinse your hair and body thoroughly to remove the  shampoo.                           4.  Use CHG  as you would any other liquid soap.  You can apply chg directly  to the skin and wash                       Gently with a scrungie or clean washcloth.  5.  Apply the CHG Soap to your body ONLY FROM THE NECK DOWN.   Do not use on face/ open                           Wound or open sores. Avoid contact with eyes, ears mouth and genitals (private parts).                       Wash face,  Genitals (private parts) with your normal soap.             6.  Wash thoroughly, paying special attention to the area where your surgery  will be performed.  7.  Thoroughly rinse your body with warm water from the neck down.  8.  DO NOT shower/wash with your normal soap after using and rinsing off  the CHG Soap.                9.  Pat yourself dry with a clean towel.            10.  Wear clean pajamas.            11.  Place clean sheets on your bed the night of your first shower and do not  sleep with pets. Day of Surgery : Do not apply any lotions/deodorants the morning of surgery.  Please wear clean clothes to the hospital/surgery center.  FAILURE TO FOLLOW THESE INSTRUCTIONS MAY RESULT IN THE CANCELLATION OF YOUR SURGERY PATIENT SIGNATURE_________________________________  NURSE SIGNATURE__________________________________  ________________________________________________________________________   Daryl Chen  An incentive spirometer is a tool that can help keep your lungs clear and active. This tool measures how well you are filling your lungs with each breath. Taking long deep breaths may help reverse or decrease the chance of developing breathing (pulmonary) problems (especially infection) following:  A long period of time when you are unable to move or be active. BEFORE THE PROCEDURE   If the spirometer includes an indicator to show your best effort, your nurse or respiratory therapist will set it to a desired goal.  If possible, sit up straight or lean slightly forward. Try not to  slouch.  Hold the incentive spirometer in an upright position. INSTRUCTIONS FOR USE  1. Sit on the edge of your bed if possible, or sit up as far as you can in bed or on a chair. 2. Hold the incentive spirometer in an upright position. 3. Breathe out normally. 4. Place the mouthpiece in your mouth and seal your lips tightly around it. 5. Breathe in slowly and as deeply as possible, raising the piston or the ball toward the top of the column. 6. Hold your breath for 3-5 seconds or for as long as possible. Allow the piston or ball to fall to the bottom of the column. 7. Remove the mouthpiece from your mouth and breathe out  normally. 8. Rest for a few seconds and repeat Steps 1 through 7 at least 10 times every 1-2 hours when you are awake. Take your time and take a few normal breaths between deep breaths. 9. The spirometer may include an indicator to show your best effort. Use the indicator as a goal to work toward during each repetition. 10. After each set of 10 deep breaths, practice coughing to be sure your lungs are clear. If you have an incision (the cut made at the time of surgery), support your incision when coughing by placing a pillow or rolled up towels firmly against it. Once you are able to get out of bed, walk around indoors and cough well. You may stop using the incentive spirometer when instructed by your caregiver.  RISKS AND COMPLICATIONS  Take your time so you do not get dizzy or light-headed.  If you are in pain, you may need to take or ask for pain medication before doing incentive spirometry. It is harder to take a deep breath if you are having pain. AFTER USE  Rest and breathe slowly and easily.  It can be helpful to keep track of a log of your progress. Your caregiver can provide you with a simple table to help with this. If you are using the spirometer at home, follow these instructions: [place] IF:   You are having difficultly using the spirometer.  You  have trouble using the spirometer as often as instructed.  Your pain medication is not giving enough relief while using the spirometer.  You develop fever of 100.5 F (38.1 C) or higher. SEEK IMMEDIATE MEDICAL CARE IF:   You cough up bloody sputum that had not been present before.  You develop fever of 102 F (38.9 C) or greater.  You develop worsening pain at or near the incision site. MAKE SURE YOU:   Understand these instructions.  Will watch your condition.  Will get help right away if you are not doing well or get worse. Document Released: 04/25/2007 Document Revised: 03/06/2012 Document Reviewed: 06/26/2007 Exit[employer] Patient Information 2014 Exit[employer], [place].   ________________________________________________________________________  WHAT IS A BLOOD TRANSFUSION? Blood Transfusion Information  A transfusion is the replacement of blood or some of its parts. Blood is made up of multiple cells which provide different functions.  Red blood cells carry oxygen and are used for blood loss replacement.  White blood cells fight against infection.  Platelets control bleeding.  Plasma helps clot blood.  Other blood products are available for specialized needs, such as hemophilia or other clotting disorders. BEFORE THE TRANSFUSION  Who gives blood for transfusions?   Healthy volunteers who are fully evaluated to make sure their blood is safe. This is blood bank blood. Transfusion therapy is the safest it has ever been in the practice of medicine. Before blood is taken from a donor, a complete history is taken to make sure that person has no history of diseases nor engages in risky social behavior (examples are intravenous drug use or sexual activity with multiple partners). The donor's travel history is screened to minimize risk of transmitting infections, such as malaria. The donated blood is tested for signs of infectious diseases, such as HIV and hepatitis. The blood is then  tested to be sure it is compatible with you in order to minimize the chance of a transfusion reaction. If you or a relative donates blood, this is often done in anticipation of surgery and is not appropriate for emergency situations.  It takes many days to process the donated blood. RISKS AND COMPLICATIONS Although transfusion therapy is very safe and saves many lives, the main dangers of transfusion include:   Getting an infectious disease.  Developing a transfusion reaction. This is an allergic reaction to something in the blood you were given. Every precaution is taken to prevent this. The decision to have a blood transfusion has been considered carefully by your caregiver before blood is given. Blood is not given unless the benefits outweigh the risks. AFTER THE TRANSFUSION  Right after receiving a blood transfusion, you will usually feel much better and more energetic. This is especially true if your red blood cells have gotten low (anemic). The transfusion raises the level of the red blood cells which carry oxygen, and this usually causes an energy increase.  The nurse administering the transfusion will monitor you carefully for complications. HOME CARE INSTRUCTIONS  No special instructions are needed after a transfusion. You may find your energy is better. Speak with your caregiver about any limitations on activity for underlying diseases you may have. SEEK MEDICAL CARE IF:   Your condition is not improving after your transfusion.  You develop redness or irritation at the intravenous (IV) site. SEEK IMMEDIATE MEDICAL CARE IF:  Any of the following symptoms occur over the next 12 hours:  Shaking chills.  You have a temperature by mouth above 102 F (38.9 C), not controlled by medicine.  Chest, back, or muscle pain.  People around you feel you are not acting correctly or are confused.  Shortness of breath or difficulty breathing.  Dizziness and fainting.  You get a rash or  develop hives.  You have a decrease in urine output.  Your urine turns a dark color or changes to pink, red, or brown. Any of the following symptoms occur over the next 10 days:  You have a temperature by mouth above 102 F (38.9 C), not controlled by medicine.  Shortness of breath.  Weakness after normal activity.  The white part of the eye turns yellow (jaundice).  You have a decrease in the amount of urine or are urinating less often.  Your urine turns a dark color or changes to pink, red, or brown. Document Released: 12/10/2000 Document Revised: 03/06/2012 Document Reviewed: 07/29/2008 [facility] Patient Information 2014 [place], [place].  _______________________________________________________________________

## 2019-07-20 ENCOUNTER — Encounter (HOSPITAL_COMMUNITY): Payer: Self-pay

## 2019-07-20 ENCOUNTER — Other Ambulatory Visit: Payer: Self-pay

## 2019-07-20 ENCOUNTER — Ambulatory Visit (HOSPITAL_COMMUNITY)
Admission: RE | Admit: 2019-07-20 | Discharge: 2019-07-20 | Disposition: A | Discharge status: Home / Self Care | Payer: 59 | Source: Ambulatory Visit | Attending: Orthopedic Surgery | Admitting: Orthopedic Surgery

## 2019-07-20 ENCOUNTER — Other Ambulatory Visit (HOSPITAL_COMMUNITY)
Admission: RE | Admit: 2019-07-20 | Discharge: 2019-07-20 | Disposition: A | Discharge status: Home / Self Care | Payer: 59 | Source: Ambulatory Visit

## 2019-07-20 ENCOUNTER — Encounter (HOSPITAL_COMMUNITY)
Admission: RE | Admit: 2019-07-20 | Discharge: 2019-07-20 | Disposition: A | Discharge status: Home / Self Care | Payer: 59 | Source: Ambulatory Visit | Attending: Orthopedic Surgery | Admitting: Orthopedic Surgery

## 2019-07-20 DIAGNOSIS — Z01818 Encounter for other preprocedural examination: Secondary | ICD-10-CM

## 2019-07-20 HISTORY — DX: Unspecified osteoarthritis, unspecified site: M19.90

## 2019-07-20 HISTORY — DX: Prediabetes: R73.03

## 2019-07-20 LAB — ABO/RH: ABO/RH(D): B POS

## 2019-07-20 LAB — HEMOGLOBIN A1C
Hgb A1c MFr Bld: 6.1 % — ABNORMAL HIGH (ref 4.8–5.6)
Mean Plasma Glucose: 128.37 mg/dL

## 2019-07-20 LAB — CBC WITH DIFFERENTIAL/PLATELET
Abs Immature Granulocytes: 0.01 10*3/uL (ref 0.00–0.07)
Basophils Absolute: 0 10*3/uL (ref 0.0–0.1)
Basophils Relative: 1 %
Eosinophils Absolute: 0 10*3/uL (ref 0.0–0.5)
Eosinophils Relative: 1 %
HCT: 44.1 % (ref 39.0–52.0)
Hemoglobin: 14.5 g/dL (ref 13.0–17.0)
Immature Granulocytes: 0 %
Lymphocytes Relative: 35 %
Lymphs Abs: 1.3 10*3/uL (ref 0.7–4.0)
MCH: 30.1 pg (ref 26.0–34.0)
MCHC: 32.9 g/dL (ref 30.0–36.0)
MCV: 91.5 fL (ref 80.0–100.0)
Monocytes Absolute: 0.4 10*3/uL (ref 0.1–1.0)
Monocytes Relative: 10 %
Neutro Abs: 2 10*3/uL (ref 1.7–7.7)
Neutrophils Relative %: 53 %
Platelets: 192 10*3/uL (ref 150–400)
RBC: 4.82 MIL/uL (ref 4.22–5.81)
RDW: 13.1 % (ref 11.5–15.5)
WBC: 3.7 10*3/uL — ABNORMAL LOW (ref 4.0–10.5)
nRBC: 0 % (ref 0.0–0.2)

## 2019-07-20 LAB — COMPREHENSIVE METABOLIC PANEL
ALT: 19 U/L (ref 0–44)
AST: 27 U/L (ref 15–41)
Albumin: 4.7 g/dL (ref 3.5–5.0)
Alkaline Phosphatase: 52 U/L (ref 38–126)
Anion gap: 12 (ref 5–15)
BUN: 14 mg/dL (ref 6–20)
CO2: 23 mmol/L (ref 22–32)
Calcium: 9.3 mg/dL (ref 8.9–10.3)
Chloride: 103 mmol/L (ref 98–111)
Creatinine, Ser: 1.04 mg/dL (ref 0.61–1.24)
GFR calc Af Amer: >60 mL/min (ref >60)
GFR calc non Af Amer: >60 mL/min (ref >60)
Glucose, Bld: 115 mg/dL — ABNORMAL HIGH (ref 70–99)
Potassium: 4.2 mmol/L (ref 3.5–5.1)
Sodium: 138 mmol/L (ref 135–145)
Total Bilirubin: 0.6 mg/dL (ref 0.3–1.2)
Total Protein: 8.3 g/dL — ABNORMAL HIGH (ref 6.5–8.1)

## 2019-07-20 LAB — URINALYSIS, ROUTINE W REFLEX MICROSCOPIC
Bilirubin Urine: NEGATIVE
Glucose, UA: NEGATIVE mg/dL
Hgb urine dipstick: NEGATIVE
Ketones, ur: 5 mg/dL — AB
Leukocytes,Ua: NEGATIVE
Nitrite: NEGATIVE
Protein, ur: NEGATIVE mg/dL
Specific Gravity, Urine: 1.014 (ref 1.005–1.030)
pH: 5 (ref 5.0–8.0)

## 2019-07-20 LAB — PROTIME-INR
INR: 1 (ref 0.8–1.2)
Prothrombin Time: 12.8 seconds (ref 11.4–15.2)

## 2019-07-20 LAB — SURGICAL PCR SCREEN
MRSA, PCR: POSITIVE — AB
Staphylococcus aureus: POSITIVE — AB

## 2019-07-20 LAB — APTT: aPTT: 29 seconds (ref 24–36)

## 2019-07-21 LAB — SARS CORONAVIRUS 2 (TAT 6-24 HRS): SARS Coronavirus 2: NEGATIVE

## 2019-07-22 MED ORDER — BUPIVACAINE LIPOSOME 1.3 % IJ SUSP
10.0000 mL | INTRAMUSCULAR | Status: DC
  Filled 2019-07-22: qty 10

## 2019-07-22 MED ORDER — TRANEXAMIC ACID 1000 MG/10ML IV SOLN
2000.0000 mg | INTRAVENOUS | Status: DC
  Filled 2019-07-22: qty 20

## 2019-07-23 ENCOUNTER — Observation Stay (HOSPITAL_COMMUNITY)
Admission: RE | Admit: 2019-07-23 | Discharge: 2019-07-24 | Disposition: A | Discharge status: Home / Self Care | Payer: 59 | Attending: Orthopedic Surgery | Admitting: Orthopedic Surgery

## 2019-07-23 ENCOUNTER — Encounter (HOSPITAL_COMMUNITY)
Admission: RE | Disposition: A | Discharge status: Home / Self Care | Payer: Self-pay | Source: Home / Self Care | Attending: Orthopedic Surgery

## 2019-07-23 ENCOUNTER — Encounter (HOSPITAL_COMMUNITY): Payer: Self-pay

## 2019-07-23 ENCOUNTER — Ambulatory Visit (HOSPITAL_COMMUNITY): Payer: 59 | Admitting: Physician Assistant

## 2019-07-23 ENCOUNTER — Ambulatory Visit (HOSPITAL_COMMUNITY): Payer: 59 | Admitting: Certified Registered Nurse Anesthetist

## 2019-07-23 ENCOUNTER — Ambulatory Visit (HOSPITAL_COMMUNITY): Payer: 59

## 2019-07-23 ENCOUNTER — Other Ambulatory Visit: Payer: Self-pay

## 2019-07-23 DIAGNOSIS — M1612 Unilateral primary osteoarthritis, left hip: Secondary | ICD-10-CM | POA: Diagnosis not present

## 2019-07-23 DIAGNOSIS — Z791 Long term (current) use of non-steroidal anti-inflammatories (NSAID): Secondary | ICD-10-CM | POA: Insufficient documentation

## 2019-07-23 DIAGNOSIS — Z7982 Long term (current) use of aspirin: Secondary | ICD-10-CM | POA: Insufficient documentation

## 2019-07-23 DIAGNOSIS — Z96642 Presence of left artificial hip joint: Secondary | ICD-10-CM

## 2019-07-23 DIAGNOSIS — I10 Essential (primary) hypertension: Secondary | ICD-10-CM | POA: Diagnosis not present

## 2019-07-23 DIAGNOSIS — Z419 Encounter for procedure for purposes other than remedying health state, unspecified: Secondary | ICD-10-CM

## 2019-07-23 HISTORY — PX: TOTAL HIP ARTHROPLASTY: SHX124

## 2019-07-23 LAB — TYPE AND SCREEN
ABO/RH(D): B POS
Antibody Screen: NEGATIVE

## 2019-07-23 SURGERY — ARTHROPLASTY, HIP, TOTAL, ANTERIOR APPROACH
Anesthesia: Spinal | Site: Hip | Laterality: Left

## 2019-07-23 MED ORDER — GABAPENTIN 300 MG PO CAPS
300.0000 mg | ORAL_CAPSULE | Freq: Three times a day (TID) | ORAL | Status: DC
  Administered 2019-07-23: 22:00:00 300 mg via ORAL
  Filled 2019-07-23: qty 1

## 2019-07-23 MED ORDER — ONDANSETRON HCL 4 MG/2ML IJ SOLN: 4.0000 mg | Freq: Four times a day (QID) | INTRAMUSCULAR | Status: DC | PRN

## 2019-07-23 MED ORDER — MIDAZOLAM HCL 2 MG/2ML IJ SOLN
INTRAMUSCULAR | Status: AC
  Filled 2019-07-23: qty 2

## 2019-07-23 MED ORDER — BISACODYL 5 MG PO TBEC: 5.0000 mg | DELAYED_RELEASE_TABLET | Freq: Every day | ORAL | Status: DC | PRN

## 2019-07-23 MED ORDER — BUPIVACAINE-EPINEPHRINE (PF) 0.25% -1:200000 IJ SOLN
INTRAMUSCULAR | Status: AC
  Filled 2019-07-23: qty 30

## 2019-07-23 MED ORDER — CELECOXIB 200 MG PO CAPS
200.0000 mg | ORAL_CAPSULE | Freq: Two times a day (BID) | ORAL | Status: DC
  Administered 2019-07-23 – 2019-07-24 (×2): 200 mg via ORAL
  Filled 2019-07-23 (×2): qty 1

## 2019-07-23 MED ORDER — MIDAZOLAM HCL 5 MG/5ML IJ SOLN
INTRAMUSCULAR | Status: DC | PRN
  Administered 2019-07-23: 2 mg via INTRAVENOUS

## 2019-07-23 MED ORDER — DOCUSATE SODIUM 100 MG PO CAPS
100.0000 mg | ORAL_CAPSULE | Freq: Two times a day (BID) | ORAL | Status: DC
  Administered 2019-07-23 – 2019-07-24 (×2): 100 mg via ORAL
  Filled 2019-07-23 (×2): qty 1

## 2019-07-23 MED ORDER — ALUMINUM HYDROXIDE GEL 320 MG/5ML PO SUSP: 15.0000 mL | ORAL | Status: DC | PRN

## 2019-07-23 MED ORDER — PROMETHAZINE HCL 25 MG/ML IJ SOLN: 6.2500 mg | INTRAMUSCULAR | Status: DC | PRN

## 2019-07-23 MED ORDER — BUPIVACAINE-EPINEPHRINE 0.25% -1:200000 IJ SOLN
INTRAMUSCULAR | Status: DC | PRN
  Administered 2019-07-23: 30 mL

## 2019-07-23 MED ORDER — KCL IN DEXTROSE-NACL 20-5-0.45 MEQ/L-%-% IV SOLN
INTRAVENOUS | Status: DC
  Administered 2019-07-23 – 2019-07-24 (×2): via INTRAVENOUS
  Filled 2019-07-23 (×3): qty 1000

## 2019-07-23 MED ORDER — SODIUM CHLORIDE (PF) 0.9 % IJ SOLN
INTRAMUSCULAR | Status: AC
  Filled 2019-07-23: qty 50

## 2019-07-23 MED ORDER — PROPOFOL 10 MG/ML IV BOLUS
INTRAVENOUS | Status: AC
  Filled 2019-07-23: qty 20

## 2019-07-23 MED ORDER — POVIDONE-IODINE 10 % EX SWAB
2.0000 "application " | Freq: Once | CUTANEOUS | Status: AC
  Administered 2019-07-23: 2 via TOPICAL

## 2019-07-23 MED ORDER — METOCLOPRAMIDE HCL 5 MG/ML IJ SOLN: 5.0000 mg | Freq: Three times a day (TID) | INTRAMUSCULAR | Status: DC | PRN

## 2019-07-23 MED ORDER — 0.9 % SODIUM CHLORIDE (POUR BTL) OPTIME
TOPICAL | Status: DC | PRN
  Administered 2019-07-23: 1000 mL

## 2019-07-23 MED ORDER — HYDROMORPHONE HCL 1 MG/ML IJ SOLN: 0.5000 mg | INTRAMUSCULAR | Status: DC | PRN

## 2019-07-23 MED ORDER — HYDROMORPHONE HCL 1 MG/ML IJ SOLN: 0.2500 mg | INTRAMUSCULAR | Status: DC | PRN

## 2019-07-23 MED ORDER — METOCLOPRAMIDE HCL 5 MG PO TABS: 5.0000 mg | ORAL_TABLET | Freq: Three times a day (TID) | ORAL | Status: DC | PRN

## 2019-07-23 MED ORDER — SODIUM CHLORIDE 0.9% FLUSH
INTRAVENOUS | Status: DC | PRN
  Administered 2019-07-23: 50 mL

## 2019-07-23 MED ORDER — LACTATED RINGERS IV SOLN
INTRAVENOUS | Status: DC
  Administered 2019-07-23 (×2): via INTRAVENOUS

## 2019-07-23 MED ORDER — DEXAMETHASONE SODIUM PHOSPHATE 10 MG/ML IJ SOLN
10.0000 mg | Freq: Once | INTRAMUSCULAR | Status: AC
  Administered 2019-07-24: 10 mg via INTRAVENOUS
  Filled 2019-07-23: qty 1

## 2019-07-23 MED ORDER — ASPIRIN EC 81 MG PO TBEC: 81.0000 mg | DELAYED_RELEASE_TABLET | Freq: Two times a day (BID) | ORAL | 0 refills | Status: AC

## 2019-07-23 MED ORDER — PROPOFOL 500 MG/50ML IV EMUL
INTRAVENOUS | Status: DC | PRN
  Administered 2019-07-23: 100 ug/kg/min via INTRAVENOUS

## 2019-07-23 MED ORDER — FLEET ENEMA 7-19 GM/118ML RE ENEM: 1.0000 | ENEMA | Freq: Once | RECTAL | Status: DC | PRN

## 2019-07-23 MED ORDER — FENTANYL CITRATE (PF) 100 MCG/2ML IJ SOLN
INTRAMUSCULAR | Status: DC | PRN
  Administered 2019-07-23 (×2): 50 ug via INTRAVENOUS

## 2019-07-23 MED ORDER — PANTOPRAZOLE SODIUM 40 MG PO TBEC
40.0000 mg | DELAYED_RELEASE_TABLET | Freq: Every day | ORAL | Status: DC
  Administered 2019-07-23: 40 mg via ORAL
  Filled 2019-07-23: qty 1

## 2019-07-23 MED ORDER — PHENOL 1.4 % MT LIQD: 1.0000 | OROMUCOSAL | Status: DC | PRN

## 2019-07-23 MED ORDER — ONDANSETRON HCL 4 MG/2ML IJ SOLN
INTRAMUSCULAR | Status: DC | PRN
  Administered 2019-07-23: 4 mg via INTRAVENOUS

## 2019-07-23 MED ORDER — PROPOFOL 10 MG/ML IV BOLUS
INTRAVENOUS | Status: AC
  Filled 2019-07-23: qty 60

## 2019-07-23 MED ORDER — CEFAZOLIN SODIUM-DEXTROSE 2-4 GM/100ML-% IV SOLN
2.0000 g | INTRAVENOUS | Status: AC
  Administered 2019-07-23: 2 g via INTRAVENOUS
  Filled 2019-07-23: qty 100

## 2019-07-23 MED ORDER — MENTHOL 3 MG MT LOZG: 1.0000 | LOZENGE | OROMUCOSAL | Status: DC | PRN

## 2019-07-23 MED ORDER — CHLORHEXIDINE GLUCONATE 4 % EX LIQD: 60.0000 mL | Freq: Once | CUTANEOUS | Status: DC

## 2019-07-23 MED ORDER — TIZANIDINE HCL 2 MG PO TABS: 2.0000 mg | ORAL_TABLET | Freq: Four times a day (QID) | ORAL | 0 refills | Status: AC | PRN

## 2019-07-23 MED ORDER — ACETAMINOPHEN 325 MG PO TABS: 325.0000 mg | ORAL_TABLET | Freq: Four times a day (QID) | ORAL | Status: DC | PRN

## 2019-07-23 MED ORDER — LACTATED RINGERS IV SOLN: INTRAVENOUS | Status: DC

## 2019-07-23 MED ORDER — TRANEXAMIC ACID 1000 MG/10ML IV SOLN
INTRAVENOUS | Status: DC | PRN
  Administered 2019-07-23: 14:00:00 2000 mg via TOPICAL

## 2019-07-23 MED ORDER — ONDANSETRON HCL 4 MG/2ML IJ SOLN
INTRAMUSCULAR | Status: AC
  Filled 2019-07-23: qty 2

## 2019-07-23 MED ORDER — DEXAMETHASONE SODIUM PHOSPHATE 10 MG/ML IJ SOLN
INTRAMUSCULAR | Status: AC
  Filled 2019-07-23: qty 1

## 2019-07-23 MED ORDER — TRANEXAMIC ACID-NACL 1000-0.7 MG/100ML-% IV SOLN
1000.0000 mg | INTRAVENOUS | Status: AC
  Administered 2019-07-23: 14:00:00 1000 mg via INTRAVENOUS
  Filled 2019-07-23: qty 100

## 2019-07-23 MED ORDER — ACETAMINOPHEN 500 MG PO TABS
1000.0000 mg | ORAL_TABLET | Freq: Four times a day (QID) | ORAL | Status: DC
  Administered 2019-07-23 – 2019-07-24 (×3): 1000 mg via ORAL
  Filled 2019-07-23 (×3): qty 2

## 2019-07-23 MED ORDER — BUPIVACAINE HCL (PF) 0.75 % IJ SOLN
INTRAMUSCULAR | Status: DC | PRN
  Administered 2019-07-23: 2 mL via INTRATHECAL

## 2019-07-23 MED ORDER — VANCOMYCIN HCL IN DEXTROSE 1-5 GM/200ML-% IV SOLN
1000.0000 mg | INTRAVENOUS | Status: AC
  Administered 2019-07-23: 1000 mg via INTRAVENOUS
  Filled 2019-07-23: qty 200

## 2019-07-23 MED ORDER — METHOCARBAMOL 1000 MG/10ML IJ SOLN
500.0000 mg | Freq: Four times a day (QID) | INTRAVENOUS | Status: DC | PRN
  Filled 2019-07-23: qty 5

## 2019-07-23 MED ORDER — OXYCODONE HCL 5 MG PO TABS
5.0000 mg | ORAL_TABLET | ORAL | Status: DC | PRN
  Administered 2019-07-23 – 2019-07-24 (×3): 10 mg via ORAL
  Filled 2019-07-23 (×3): qty 2

## 2019-07-23 MED ORDER — BUPIVACAINE LIPOSOME 1.3 % IJ SUSP
INTRAMUSCULAR | Status: DC | PRN
  Administered 2019-07-23: 10 mL

## 2019-07-23 MED ORDER — POLYETHYLENE GLYCOL 3350 17 G PO PACK: 17.0000 g | PACK | Freq: Every day | ORAL | Status: DC | PRN

## 2019-07-23 MED ORDER — METHOCARBAMOL 500 MG PO TABS
500.0000 mg | ORAL_TABLET | Freq: Four times a day (QID) | ORAL | Status: DC | PRN
  Administered 2019-07-23 – 2019-07-24 (×2): 500 mg via ORAL
  Filled 2019-07-23 (×2): qty 1

## 2019-07-23 MED ORDER — STERILE WATER FOR IRRIGATION IR SOLN
Status: DC | PRN
  Administered 2019-07-23 (×2): 1000 mL

## 2019-07-23 MED ORDER — OXYCODONE-ACETAMINOPHEN 5-325 MG PO TABS: 1.0000 | ORAL_TABLET | ORAL | 0 refills | Status: AC | PRN

## 2019-07-23 MED ORDER — ONDANSETRON HCL 4 MG PO TABS: 4.0000 mg | ORAL_TABLET | Freq: Four times a day (QID) | ORAL | Status: DC | PRN

## 2019-07-23 MED ORDER — ASPIRIN 81 MG PO CHEW
81.0000 mg | CHEWABLE_TABLET | Freq: Two times a day (BID) | ORAL | Status: DC
  Administered 2019-07-23 – 2019-07-24 (×2): 81 mg via ORAL
  Filled 2019-07-23 (×2): qty 1

## 2019-07-23 MED ORDER — DEXAMETHASONE SODIUM PHOSPHATE 10 MG/ML IJ SOLN
INTRAMUSCULAR | Status: DC | PRN
  Administered 2019-07-23: 10 mg via INTRAVENOUS

## 2019-07-23 MED ORDER — DIPHENHYDRAMINE HCL 12.5 MG/5ML PO ELIX: 12.5000 mg | ORAL_SOLUTION | ORAL | Status: DC | PRN

## 2019-07-23 MED ORDER — FENTANYL CITRATE (PF) 100 MCG/2ML IJ SOLN
INTRAMUSCULAR | Status: AC
  Filled 2019-07-23: qty 2

## 2019-07-23 MED ORDER — TRANEXAMIC ACID-NACL 1000-0.7 MG/100ML-% IV SOLN
1000.0000 mg | Freq: Once | INTRAVENOUS | Status: AC
  Administered 2019-07-23: 1000 mg via INTRAVENOUS
  Filled 2019-07-23: qty 100

## 2019-07-23 SURGICAL SUPPLY — 48 items
BAG DECANTER FOR FLEXI CONT (MISCELLANEOUS) ×3 IMPLANT
BLADE SAW SGTL 18X1.27X75 (BLADE) ×2 IMPLANT
BLADE SAW SGTL 18X1.27X75MM (BLADE) ×1
BLADE SURG SZ10 CARB STEEL (BLADE) ×6 IMPLANT
CONT SPEC 4OZ CLIKSEAL STRL BL (MISCELLANEOUS) ×3 IMPLANT
COVER PERINEAL POST (MISCELLANEOUS) ×3 IMPLANT
COVER SURGICAL LIGHT HANDLE (MISCELLANEOUS) ×3 IMPLANT
COVER WAND RF STERILE (DRAPES) ×3 IMPLANT
CUP ACETBLR 52 OD 100 SERIES (Hips) ×2 IMPLANT
DECANTER SPIKE VIAL GLASS SM (MISCELLANEOUS) ×6 IMPLANT
DRAPE STERI IOBAN 125X83 (DRAPES) ×3 IMPLANT
DRAPE U-SHAPE 47X51 STRL (DRAPES) ×6 IMPLANT
DRESSING AQUACEL AG SP 3.5X10 (GAUZE/BANDAGES/DRESSINGS) IMPLANT
DRSG AQUACEL AG ADV 3.5X10 (GAUZE/BANDAGES/DRESSINGS) ×3 IMPLANT
DRSG AQUACEL AG SP 3.5X10 (GAUZE/BANDAGES/DRESSINGS) ×3
DURAPREP 26ML APPLICATOR (WOUND CARE) ×3 IMPLANT
ELECT BLADE TIP CTD 4 INCH (ELECTRODE) ×3 IMPLANT
ELECT REM PT RETURN 15FT ADLT (MISCELLANEOUS) ×3 IMPLANT
ELIMINATOR HOLE APEX DEPUY (Hips) ×2 IMPLANT
GLOVE BIO SURGEON STRL SZ7.5 (GLOVE) ×3 IMPLANT
GLOVE BIO SURGEON STRL SZ8.5 (GLOVE) ×3 IMPLANT
GLOVE BIOGEL PI IND STRL 8 (GLOVE) ×1 IMPLANT
GLOVE BIOGEL PI IND STRL 9 (GLOVE) ×1 IMPLANT
GLOVE BIOGEL PI INDICATOR 8 (GLOVE) ×2
GLOVE BIOGEL PI INDICATOR 9 (GLOVE) ×2
GOWN STRL REUS W/TWL XL LVL3 (GOWN DISPOSABLE) ×6 IMPLANT
HEAD CERAMIC DELTA 36 PLUS 1.5 (Hips) ×2 IMPLANT
HOLDER FOLEY CATH W/STRAP (MISCELLANEOUS) ×3 IMPLANT
KIT TURNOVER KIT A (KITS) IMPLANT
LINER NEUTRAL 52X36MM PLUS 4 (Liner) ×2 IMPLANT
MANIFOLD NEPTUNE II (INSTRUMENTS) ×3 IMPLANT
NDL HYPO 21X1.5 SAFETY (NEEDLE) ×2 IMPLANT
NEEDLE HYPO 21X1.5 SAFETY (NEEDLE) ×6 IMPLANT
NS IRRIG 1000ML POUR BTL (IV SOLUTION) ×3 IMPLANT
PACK ANTERIOR HIP CUSTOM (KITS) ×3 IMPLANT
STEM FEMORAL SZ5 HIGH ACTIS (Stem) ×2 IMPLANT
SUT ETHIBOND NAB CT1 #1 30IN (SUTURE) ×3 IMPLANT
SUT VIC AB 0 CT1 27 (SUTURE) ×2
SUT VIC AB 0 CT1 27XBRD ANBCTR (SUTURE) ×1 IMPLANT
SUT VIC AB 1 CTX 36 (SUTURE) ×2
SUT VIC AB 1 CTX36XBRD ANBCTR (SUTURE) ×1 IMPLANT
SUT VIC AB 2-0 CT1 27 (SUTURE) ×2
SUT VIC AB 2-0 CT1 TAPERPNT 27 (SUTURE) ×1 IMPLANT
SUT VIC AB 3-0 CT1 27 (SUTURE) ×2
SUT VIC AB 3-0 CT1 TAPERPNT 27 (SUTURE) ×1 IMPLANT
SYR CONTROL 10ML LL (SYRINGE) ×9 IMPLANT
TRAY FOLEY MTR SLVR 16FR STAT (SET/KITS/TRAYS/PACK) IMPLANT
YANKAUER SUCT BULB TIP 10FT TU (MISCELLANEOUS) ×3 IMPLANT

## 2019-07-23 NOTE — Anesthesia Preprocedure Evaluation (Signed)
Anesthesia Evaluation  Patient identified by MRN, date of birth, ID band Patient awake    Reviewed: Allergy & Precautions, NPO status , Patient's Chart, lab work & pertinent test results  Airway Mallampati: II  TM Distance: >3 FB Neck ROM: Full    Dental no notable dental hx.    Pulmonary neg pulmonary ROS,    Pulmonary exam normal breath sounds clear to auscultation       Cardiovascular hypertension, Normal cardiovascular exam Rhythm:Regular Rate:Normal  Untreated HTN   Neuro/Psych negative neurological ROS  negative psych ROS   GI/Hepatic negative GI ROS, Neg liver ROS,   Endo/Other    Renal/GU negative Renal ROS  negative genitourinary   Musculoskeletal  (+) Arthritis , Osteoarthritis,    Abdominal   Peds negative pediatric ROS (+)  Hematology negative hematology ROS (+)   Anesthesia Other Findings   Reproductive/Obstetrics negative OB ROS                             Anesthesia Physical Anesthesia Plan  ASA: III  Anesthesia Plan: Spinal   Post-op Pain Management:    Induction: Intravenous  PONV Risk Score and Plan: 1 and Ondansetron, Dexamethasone and Treatment may vary due to age or medical condition  Airway Management Planned: Simple Face Mask  Additional Equipment:   Intra-op Plan:   Post-operative Plan:   Informed Consent: I have reviewed the patients History and Physical, chart, labs and discussed the procedure including the risks, benefits and alternatives for the proposed anesthesia with the patient or authorized representative who has indicated his/her understanding and acceptance.     Dental advisory given  Plan Discussed with: CRNA and Surgeon  Anesthesia Plan Comments:         Anesthesia Quick Evaluation

## 2019-07-23 NOTE — Anesthesia Procedure Notes (Signed)
Procedure Name: MAC Date/Time: 07/23/2019 1:50 PM Performed by: Claudia Desanctis, CRNA Oxygen Delivery Method: Simple face mask

## 2019-07-23 NOTE — Anesthesia Procedure Notes (Signed)
Spinal  Patient location during procedure: OR Start time: 07/23/2019 1:42 PM End time: 07/23/2019 1:45 PM Staffing Resident/CRNA: Claudia Desanctis, CRNA Performed: resident/CRNA  Preanesthetic Checklist Completed: patient identified, site marked, surgical consent, pre-op evaluation, timeout performed, IV checked, risks and benefits discussed and monitors and equipment checked Spinal Block Patient position: sitting Prep: DuraPrep Patient monitoring: heart rate, cardiac monitor, continuous pulse ox and blood pressure Approach: midline Location: L3-4 Injection technique: single-shot Needle Needle type: Sprotte  Needle gauge: 24 G Needle length: 9 cm Needle insertion depth: 8 cm Assessment Sensory level: T4

## 2019-07-23 NOTE — Op Note (Signed)
OPERATIVE REPORT    DATE OF PROCEDURE:  07/23/2019       PREOPERATIVE DIAGNOSIS:  Left Hip Osteoarthritis                                                          POSTOPERATIVE DIAGNOSIS:  Left Hip Osteoarthritis                                                           PROCEDURE: Anterior L total hip arthroplasty using a 52 mm DePuy Pinnacle  Cup, [employer], 0-degree polyethylene liner, a +1 mm x 18mm ceramic head, a 5 hi Depuy Actis stem   SURGEON: Kerin Salen    ASSISTANT:   Kerry Hough. [employer]  (present throughout entire procedure and necessary for timely completion of the procedure)   ANESTHESIA: Spinal BLOOD LOSS: 300 cc FLUID REPLACEMENT: 1500 cc crystalloid Antibiotic: 2gm ancef 1gm vanc Tranexamic Acid: 1gm IV, 2gm Topical Exparel: 266mg  COMPLICATIONS: none    INDICATIONS FOR PROCEDURE: A 53 y.o. year-old With  Left Hip Osteoarthritis   for 4 years, x-rays show bone-on-bone arthritic changes, and osteophytes. Despite conservative measures with observation, anti-inflammatory medicine, narcotics, use of a cane, has severe unremitting pain and can ambulate only a few blocks before resting. Patient desires elective L total hip arthroplasty to decrease pain and increase function. The risks, benefits, and alternatives were discussed at length including but not limited to the risks of infection, bleeding, nerve injury, stiffness, blood clots, the need for revision surgery, cardiopulmonary complications, among others, and they were willing to proceed. Questions answered      PROCEDURE IN DETAIL: The patient was identified by armband,   received preoperative IV antibiotics in the holding area at [facility], taken to the operating room , appropriate anesthetic monitors   were attached and  anesthesia was induced with the patient on the gurney. The HANA boots were applied to the feet and the patient  was transferred to the HANA table with a peroneal post and  support underneath the non-operative leg, which was locked in 2 lb traction. Theoperative lower extremity was then prepped and draped in the usual sterile fashion from just above the iliac crest to the knee. And a timeout procedure was performed. We then made a 12 cm incision along the interval at the leading edge of the tensor fascia lata of starting at 2 cm lateral to the ASIS. Small bleeders in the skin and subcutaneous tissue identified and cauterized we dissected down to the fascia and made an incision in the fascia allowing [place] to elevate the fascia of the tensor muscle and exploited the interval between the rectus and the tensor fascia lata. A Cobra retractor was then placed along the superior neck of the femur. A cerebellar retractor was used to expose the interval between the tensor fascia lata and the rectus femoris. .  We identified and cauterized the ascending branch of the anterior circumflex artery. A second Cobra retractor along the inferior neck of the femur. A small Hohmann retractor was placed underneath the origin of the rectus femoris,  giving us good medial exposure. Using Ronguers fatty tissue was removed from in front of the anterior capsule. The capsule was then incised, starting out at the superior anterior aspect of the acetabulum going laterally along the anterior neck. The capsule was then teed along the neck superiorly and inferiorly. Electrocautery was used to release capsule from the anterior and medial neck of the femur to allow external rotation. Cobra retractors were then placed along the inferior and superior neck allowing us to perform a standard neck cut and removed the femoral head with a power corkscrew. We then placed a medium bent homan retractor in the cotyloid notch and posteriorly along the acetabular rim a narrow Cobra retractor. Exposed labral tissue was then removed with the electrocautery. We then sequentially reamed up to a 51 mm basket reamer obtaining good coverage in  all quadrants, verified by C-arm imaging. Under C-arm control we then hammered into place a 52 mm Pinnacle cup in 45 of abduction and 15 of anteversion. The cup seated nicely and required no supplemental screws. We then placed a central hole Eliminator and a 0 polyethylene liner. The foot was then externally rotated to 130-140. The limb was extended and adducted delivering the proximal femur up into the wound. A medium curved Hohmann retractor was placed over the greater trochanter and a long Homan retractor along the posterior femoral neck completing the exposure. We then performed releases superiorly and and inferiorly of the capsule going back to the pirformis fossa superiorly and to the lesser trochanter inferiorly. We then entered the proximal femur with the box cutting offset chisel followed by, a canal sounder, the chili pepper and broaching up to a 5 broach. This seated nicely and we reamed the calcar. A trial reduction was performed with a 1 mm 36 mm head.The limb lengths were excellent the hip was stable in 90 of external rotation. At this point the trial components removed and we hammered into place a # 5 hi  Offset Actis stem with Gryption coating. A +1 mm x 36 ceramic head was then hammered into place. The hip was reduced and final C-arm images obtained. The wound was thoroughly irrigated with normal saline solution. We repaired the ant capsule and the tensor fascia lot a with running 0 vicryl suture. the subcutaneous tissue was closed with 2-0 and 3-0 Vicryl suture followed by an Aquacil dressing. At this point the patient was awaken and transferred to hospital gurney without difficulty.   Nestor LewandowskyFrank J Asyria Kolander 07/23/2019, 2:58 PM

## 2019-07-23 NOTE — Plan of Care (Signed)
Plan of care 

## 2019-07-23 NOTE — Transfer of Care (Addendum)
Immediate Anesthesia Transfer of Care Note  Patient: Daryl Chen  Procedure(s) Performed: TOTAL HIP ARTHROPLASTY ANTERIOR APPROACH (Left Hip)  Patient Location: PACU  Anesthesia Type:spinal  Level of Consciousness: drowsy  Airway & Oxygen Therapy: Patient Spontanous Breathing and Patient connected to face mask  Post-op Assessment: Report given to RN and Post -op Vital signs reviewed and stable  Post vital signs: Reviewed and stable  Last Vitals:  Vitals Value Taken Time  BP 116/67 07/23/19 1530  Temp    Pulse 68 07/23/19 1532  Resp 13 07/23/19 1532  SpO2 100 % 07/23/19 1532  Vitals shown include unvalidated device data.  Last Pain:  Vitals:   07/23/19 1300  TempSrc:   PainSc: 0-No pain         Complications: No apparent anesthesia complications

## 2019-07-23 NOTE — Anesthesia Postprocedure Evaluation (Signed)
Anesthesia Post Note  Patient: Daryl Chen  Procedure(s) Performed: TOTAL HIP ARTHROPLASTY ANTERIOR APPROACH (Left Hip)     Patient location during evaluation: PACU Anesthesia Type: Spinal Level of consciousness: oriented and awake and alert Pain management: pain level controlled Vital Signs Assessment: post-procedure vital signs reviewed and stable Respiratory status: spontaneous breathing, respiratory function stable and nonlabored ventilation Cardiovascular status: blood pressure returned to baseline and stable Postop Assessment: no headache, no backache, no apparent nausea or vomiting, spinal receding and patient able to bend at knees Anesthetic complications: no    Last Vitals:  Vitals:   07/23/19 1600 07/23/19 1630  BP: 125/81 127/85  Pulse: (!) 55 (!) 50  Resp: 14 (!) 8  Temp:    SpO2: 99% 100%    Last Pain:  Vitals:   07/23/19 1630  TempSrc:   PainSc: 0-No pain                 Daryl Radcliffe A.

## 2019-07-23 NOTE — Interval H&P Note (Signed)
History and Physical Interval Note:  07/23/2019 1:32 PM  Daryl Chen  has presented today for surgery, with the diagnosis of Left Hip Osteoarthritis.  The various methods of treatment have been discussed with the patient and family. After consideration of risks, benefits and other options for treatment, the patient has consented to  Procedure(s): TOTAL HIP ARTHROPLASTY ANTERIOR APPROACH (Left) as a surgical intervention.  The patient's history has been reviewed, patient examined, no change in status, stable for surgery.  I have reviewed the patient's chart and labs.  Questions were answered to the patient's satisfaction.     Kerin Salen

## 2019-07-23 NOTE — Discharge Instructions (Signed)

## 2019-07-24 ENCOUNTER — Encounter (HOSPITAL_COMMUNITY): Payer: Self-pay | Admitting: Orthopedic Surgery

## 2019-07-24 DIAGNOSIS — M1612 Unilateral primary osteoarthritis, left hip: Secondary | ICD-10-CM | POA: Diagnosis not present

## 2019-07-24 LAB — CBC
HCT: 36.4 % — ABNORMAL LOW (ref 39.0–52.0)
Hemoglobin: 12.2 g/dL — ABNORMAL LOW (ref 13.0–17.0)
MCH: 30.7 pg (ref 26.0–34.0)
MCHC: 33.5 g/dL (ref 30.0–36.0)
MCV: 91.5 fL (ref 80.0–100.0)
Platelets: 172 10*3/uL (ref 150–400)
RBC: 3.98 MIL/uL — ABNORMAL LOW (ref 4.22–5.81)
RDW: 13.2 % (ref 11.5–15.5)
WBC: 9.4 10*3/uL (ref 4.0–10.5)
nRBC: 0 % (ref 0.0–0.2)

## 2019-07-24 LAB — BASIC METABOLIC PANEL
Anion gap: 8 (ref 5–15)
BUN: 12 mg/dL (ref 6–20)
CO2: 24 mmol/L (ref 22–32)
Calcium: 8.9 mg/dL (ref 8.9–10.3)
Chloride: 101 mmol/L (ref 98–111)
Creatinine, Ser: 0.91 mg/dL (ref 0.61–1.24)
GFR calc Af Amer: >60 mL/min (ref >60)
GFR calc non Af Amer: >60 mL/min (ref >60)
Glucose, Bld: 237 mg/dL — ABNORMAL HIGH (ref 70–99)
Potassium: 4.2 mmol/L (ref 3.5–5.1)
Sodium: 133 mmol/L — ABNORMAL LOW (ref 135–145)

## 2019-07-24 NOTE — Progress Notes (Signed)
PATIENT ID: Daryl Chen  MRN: [id number]  DOB/AGE:  09-20-1966 / 53 y.o.  54 Day Post-Op Procedure(s) (LRB): TOTAL HIP ARTHROPLASTY ANTERIOR APPROACH (Left)    PROGRESS NOTE Subjective: Patient is alert, oriented, no Nausea, no Vomiting, yes passing gas, . Taking PO well. Denies SOB, Chest or Calf Pain. Using Incentive Spirometer, PAS in place. Ambulate in room Patient reports pain as  3/10  .    Objective: Vital signs in last 24 hours: Vitals:   07/23/19 2148 07/23/19 2248 07/24/19 0057 07/24/19 0521  BP: (Abnormal) 158/86 (Abnormal) 149/93 (Abnormal) 156/93 (Abnormal) 157/97  Pulse: 62 65 66 (Abnormal) 58  Resp: 16 16 14 14   Temp: 98.1 F (36.7 C) 98.1 F (36.7 C) 98 F (36.7 C) 97.8 F (36.6 C)  TempSrc: Oral Oral    SpO2: 100% 98% 99% 100%  Weight:      Height:          Intake/Output from previous day: I/O last 3 completed shifts: In: 3010.8 [P.O.:60; I.V.:2650.8; IV Piggyback:300] Out: 2850 [Urine:2750; Blood:100]   Intake/Output this shift: No intake/output data recorded.   LABORATORY DATA: Recent Labs    07/24/19 0246  WBC 9.4  HGB 12.2*  HCT 36.4*  PLT 172  NA 133*  K 4.2  CL 101  CO2 24  BUN 12  CREATININE 0.91  GLUCOSE 237*  CALCIUM 8.9    Examination: Neurologically intact ABD soft Neurovascular intact Sensation intact distally Intact pulses distally Dorsiflexion/Plantar flexion intact Incision: dressing C/D/I No cellulitis present Compartment soft} XR AP&Lat of hip shows well placed\fixed THA  Assessment:   54 Day Post-Op Procedure(s) (LRB): TOTAL HIP ARTHROPLASTY ANTERIOR APPROACH (Left) ADDITIONAL DIAGNOSIS:  Expected Acute Blood Loss Anemia,   Patient's anticipated LOS is less than 2 midnights, meeting these requirements: - Younger than 20 - Lives within 1 hour of care - Has a competent adult at home to recover with post-op recover - NO history of  - Chronic pain requiring opiods  - Diabetes  - Coronary Artery Disease  -  Heart failure  - Heart attack  - Stroke  - DVT/VTE  - Cardiac arrhythmia  - Respiratory Failure/COPD  - Renal failure  - Anemia  - Advanced Liver disease       Plan: PT/OT WBAT, THA  DVT Prophylaxis: SCDx72 hrs, ASA 81 mg BID x 2 weeks  DISCHARGE PLAN: Home, probaly today  DISCHARGE NEEDS: HHPT, Walker and 3-in-1 comode seat

## 2019-07-24 NOTE — Discharge Summary (Signed)
Patient ID: Daryl Chen MRN: [id number] DOB/AGE: 1966-10-15 53 y.o.  Admit date: 07/23/2019 Discharge date: 07/24/2019  Admission Diagnoses:  Principal Problem:   Osteoarthritis of left hip Active Problems:   Status post total replacement of left hip   Discharge Diagnoses:  Same  Past Medical History:  Diagnosis Date  . Arthritis   . Hypertension    NO MEDS TAKEN   . Pre-diabetes DX APRIL 2020    Surgeries: Procedure(s): TOTAL HIP ARTHROPLASTY ANTERIOR APPROACH on 07/23/2019   Consultants:   Discharged Condition: Improved  Hospital Course: Daryl Chen is an 53 y.o. male who was admitted 07/23/2019 for operative treatment ofOsteoarthritis of left hip. Patient has severe unremitting pain that affects sleep, daily activities, and work/hobbies. After pre-op clearance the patient was taken to the operating room on 07/23/2019 and underwent  Procedure(s): TOTAL HIP ARTHROPLASTY ANTERIOR APPROACH.    Patient was given perioperative antibiotics:  Anti-infectives (From admission, onward)   Start     Dose/Rate Route Frequency Ordered Stop   07/24/19 0600  vancomycin (VANCOCIN) IVPB 1000 mg/200 mL premix     1,000 mg 200 mL/hr over 60 Minutes Intravenous On call to O.R. 07/23/19 1244 07/23/19 1359   07/24/19 0600  ceFAZolin (ANCEF) IVPB 2g/100 mL premix     2 g 200 mL/hr over 30 Minutes Intravenous On call to O.R. 07/23/19 1244 07/23/19 1402       Patient was given sequential compression devices, early ambulation, and chemoprophylaxis to prevent DVT.  Patient benefited maximally from hospital stay and there were no complications.    Recent vital signs:  Patient Vitals for the past 24 hrs:  BP Temp Temp src Pulse Resp SpO2 Height Weight  07/24/19 0521 (Abnormal) 157/97 97.8 F (36.6 C) no documentation (Abnormal) 58 14 100 % no documentation no documentation  07/24/19 0057 (Abnormal) 156/93 98 F (36.7 C) no documentation 66 14 99 % no documentation no documentation   07/23/19 2248 (Abnormal) 149/93 98.1 F (36.7 C) Oral 65 16 98 % no documentation no documentation  07/23/19 2148 (Abnormal) 158/86 98.1 F (36.7 C) Oral 62 16 100 % no documentation no documentation  07/23/19 2009 140/81 98 F (36.7 C) no documentation 70 14 100 % no documentation no documentation  07/23/19 1813 (Abnormal) 150/95 98.8 F (37.1 C) Oral (Abnormal) 57 (Abnormal) 1 99 % 5\' 8"  (1.727 m) 79 kg  07/23/19 1730 121/78 98.6 F (37 C) no documentation (Abnormal) 55 18 100 % no documentation no documentation  07/23/19 1700 no documentation no documentation no documentation (Abnormal) 58 19 100 % no documentation no documentation  07/23/19 1630 127/85 no documentation no documentation (Abnormal) 50 (Abnormal) 8 100 % no documentation no documentation  07/23/19 1600 125/81 no documentation no documentation (Abnormal) 55 14 99 % no documentation no documentation  07/23/19 1545 no documentation no documentation no documentation no documentation no documentation 100 % no documentation no documentation  07/23/19 1530 116/67 97.7 F (36.5 C) no documentation 65 13 99 % no documentation no documentation  07/23/19 1242 (Abnormal) 168/99 98.7 F (37.1 C) Oral 77 16 99 % 5\' 8"  (1.727 m) 79 kg     Recent laboratory studies:  Recent Labs    07/24/19 0246  WBC 9.4  HGB 12.2*  HCT 36.4*  PLT 172  NA 133*  K 4.2  CL 101  CO2 24  BUN 12  CREATININE 0.91  GLUCOSE 237*  CALCIUM 8.9     Discharge Medications:   Allergies as of  07/24/2019   No Known Allergies     Medication List    Take these medications   aspirin EC 81 MG tablet Take 1 tablet (81 mg total) by mouth 2 (two) times daily.   diclofenac sodium 1 % Gel Commonly known as: VOLTAREN Apply 2 g topically 4 (four) times daily as needed (pain).   FISH OIL PO Take 2 capsules by mouth daily.   Garlic 1000 MG Caps Take 1,000 mg by mouth daily.   L-ARGININE-500 PO Take 500 mg elemental calcium/kg/hr by mouth daily.    oxyCODONE-acetaminophen 5-325 MG tablet Commonly known as: PERCOCET/ROXICET Take 1 tablet by mouth every 4 (four) hours as needed for severe pain.   tiZANidine 2 MG tablet Commonly known as: ZANAFLEX Take 1 tablet (2 mg total) by mouth every 6 (six) hours as needed.        Durable Medical Equipment  (From admission, onward)         Start     Ordered   07/23/19 1832  DME Walker rolling  Once    Question:  Patient needs a walker to treat with the following condition  Answer:  Status post total hip replacement, left   07/23/19 1831   07/23/19 1832  DME 3 n 1  Once     07/23/19 1831           Discharge Care Instructions  (From admission, onward)         Start     Ordered   07/24/19 0000  Change dressing    Comments: Change dressing Only if drainage exceeds 40% of window on dressing   07/24/19 0828          Diagnostic Studies: Dg Chest 2 View  Result Date: 07/20/2019 CLINICAL DATA:  Preop for total hip replacement. EXAM: CHEST - 2 VIEW COMPARISON:  Radiographs of December 06, 2010. FINDINGS: The heart size and mediastinal contours are within normal limits. Both lungs are clear. The visualized skeletal structures are unremarkable. IMPRESSION: No active cardiopulmonary disease. Electronically Signed   By: Lupita RaiderJames  Green Jr M.D.   On: 07/20/2019 10:46   Dg C-arm 1-60 Min-no Report  Result Date: 07/23/2019 Fluoroscopy was utilized by the requesting physician.  No radiographic interpretation.   Dg Hip Operative Unilat W Or W/o Pelvis Left  Result Date: 07/23/2019 CLINICAL DATA:  Intra op left hip total arthroplasty EXAM: OPERATIVE left HIP (WITH PELVIS IF PERFORMED) 2 VIEWS TECHNIQUE: Fluoroscopic spot image(s) were submitted for interpretation post-operatively. COMPARISON:  None. FINDINGS: Two fluoroscopic images are submitted of a left total hip arthroplasty. Acetabular and femoral hardware appears to be in place. IMPRESSION: Intra op left total hip arthroplasty.  Electronically Signed   By: Jonna ClarkBindu  Avutu M.D.   On: 07/23/2019 16:52    Disposition: Discharge disposition: 01-Home or Self Care       Discharge Instructions    Call MD / Call 911   Complete by: As directed    If you experience chest pain or shortness of breath, CALL 911 and be transported to the hospital emergency room.  If you develope a fever above 101 F, pus (white drainage) or increased drainage or redness at the wound, or calf pain, call your surgeon's office.   Change dressing   Complete by: As directed    Change dressing Only if drainage exceeds 40% of window on dressing   Constipation Prevention   Complete by: As directed    Drink plenty of fluids.  Prune juice may  be helpful.  You may use a stool softener, such as Colace (over the counter) 100 mg twice a day.  Use MiraLax (over the counter) for constipation as needed.   Diet - low sodium heart healthy   Complete by: As directed    Increase activity slowly as tolerated   Complete by: As directed       Follow-up Information    Gean Birch[place]an, Amarisa Wilinski, MD In 2 weeks.   Specialty: Orthopedic Surgery Contact information: [address] [place] [address] [phone]            Signed: Nestor LewandowskyFrank J Zane Samson 07/24/2019, 8:29 AM

## 2019-07-24 NOTE — TOC Progression Note (Signed)
Transition of Care [facility]) - Progression Note    Patient Details  Name: Daryl Chen MRN: [id number] Date of Birth: Apr 15, 1966  Transition [facility]) CM/SW [place], LCSW Phone Number: 07/24/2019, 10:26 AM  Clinical Narrative:    [facility] prearranged.  RW ordered through [place] pt. Declines 3 in 1     Barriers to Discharge: No Barriers Identified  Expected Discharge Plan and Services           Expected Discharge Date: 07/24/19               DME Arranged: Gilford Rile rolling(Declined 3 in 1) DME Agency: [employer] Date DME Agency Contacted: 07/24/19 Time DME Agency Contacted: [id number] Representative spoke with at DME Agency: [place] Determinants of Health ([place]) Interventions    Readmission Risk Interventions No flowsheet data found.

## 2019-07-24 NOTE — Evaluation (Signed)
Physical Therapy Evaluation Patient Details Name: Daryl Chen MRN: [id number] DOB: 12/06/1966 Today's Date: 07/24/2019   History of Present Illness  53 y/o s/p direct L THA on 07/23/19.  Clinical Impression  Pt admitted with above diagnosis. Pt currently with functional limitations due to the deficits listed below (see PT Problem List). Pt will benefit from skilled PT to increase their independence and safety with mobility to allow discharge to the venue listed below.  Pt moving well and was able to ambulate in the hallway and practice stairs for home entry.  Recommend RW.     Follow Up Recommendations Follow surgeon's recommendation for DC plan and follow-up therapies    Equipment Recommendations  Rolling walker with 5" wheels    Recommendations for Other Services       Precautions / Restrictions Restrictions Weight Bearing Restrictions: No      Mobility  Bed Mobility Overal bed mobility: Modified Independent             General bed mobility comments: Used rail  Transfers Overall transfer level: Needs assistance Equipment used: Rolling walker (2 wheeled) Transfers: Sit to/from Stand Sit to Stand: Min guard         General transfer comment: cues for hand placement  Ambulation/Gait Ambulation/Gait assistance: Min guard Gait Distance (Feet): 90 Feet Assistive device: Rolling walker (2 wheeled) Gait Pattern/deviations: Decreased step length - right     General Gait Details: Heavy use of UE, but pt reports it is due to fear that leg will give way.  Stairs Stairs: Yes Stairs assistance: Min guard Stair Management: Sideways;One rail Right Number of Stairs: 3 General stair comments: Pt able to negotiate stairs well with sidestepping and 1 rail  Wheelchair Mobility    Modified Rankin (Stroke Patients Only)       Balance Overall balance assessment: Needs assistance           Standing balance-Leahy Scale: Poor Standing balance comment: requires  UE support                             Pertinent Vitals/Pain Pain Assessment: 0-10 Pain Score: 0-No pain Pain Intervention(s): Premedicated before session;Ice applied    Home Living Family/patient expects to be discharged to:: Private residence Living Arrangements: Alone Available Help at Discharge: Family Type of Home: House Home Access: Stairs to enter Entrance Stairs-Rails: Psychiatric nurse of Steps: 3 Home Layout: One level Home Equipment: None      Prior Function Level of Independence: Independent         Comments: 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, newspaper        Extremity/Trunk Assessment        Lower Extremity Assessment Lower Extremity Assessment: LLE deficits/detail LLE Deficits / Details: limited due to discomfort       Communication   Communication: No difficulties  Cognition Arousal/Alertness: Awake/alert Behavior During Therapy: WFL for tasks assessed/performed Overall Cognitive Status: Within Functional Limits for tasks assessed                                        General Comments      Exercises Total Joint Exercises Ankle Circles/Pumps: AROM;Both;10 reps Quad Sets: Strengthening;Left;10 reps Short Arc Quad: Strengthening;Left;10 reps Heel Slides: AROM;Left;10 reps Hip ABduction/ADduction: AROM;Left;10 reps   Assessment/Plan  PT Assessment Patient needs continued PT services  PT Problem List Decreased strength;Decreased range of motion;Decreased mobility;Decreased knowledge of use of DME       PT Treatment Interventions DME instruction;Gait training;Stair training;Functional mobility training;Therapeutic exercise;Therapeutic activities;Balance training    PT Goals (Current goals can be found in the Care Plan section)  Acute Rehab PT Goals Patient Stated Goal: home PT Goal Formulation: With patient Time For Goal Achievement: 07/31/19    Frequency 7X/week   Barriers to  discharge        Co-evaluation               AM-PAC PT "6 Clicks" Mobility  Outcome Measure Help needed turning from your back to your side while in a flat bed without using bedrails?: None Help needed moving from lying on your back to sitting on the side of a flat bed without using bedrails?: A [place] Help needed moving to and from a bed to a chair (including a wheelchair)?: A [place] Help needed standing up from a chair using your arms (e.g., wheelchair or bedside chair)?: A [place] Help needed to walk in hospital room?: A [place] Help needed climbing 3-5 steps with a railing? : A [place] 6 Click Score: 19    End of Session Equipment Utilized During Treatment: Gait belt Activity Tolerance: Patient tolerated treatment well Patient left: in chair;with call bell/phone within reach Nurse Communication: Mobility status PT Visit Diagnosis: Difficulty in walking, not elsewhere classified (R26.2)    Time: [id number] PT Time Calculation (min) (ACUTE ONLY): 37 min   Charges:   PT Evaluation $PT Eval Moderate Complexity: 1 Mod PT Treatments $Gait Training: 8-22 mins        Karlye Ihrig L. Katrinka BlazingSmith, [place] Pager [phone] 07/24/2019   Enzo MontgomeryKaren L Priya Matsen 07/24/2019, 10:36 AM

## 2020-12-11 IMAGING — CR CHEST - 2 VIEW
2 series · 2 of 2 positions shown · non-contrast
Comparison: Radiographs December 06, 2010.

CLINICAL DATA: Preop for total hip replacement.

EXAM:
CHEST - 2 VIEW

[w chest pa]
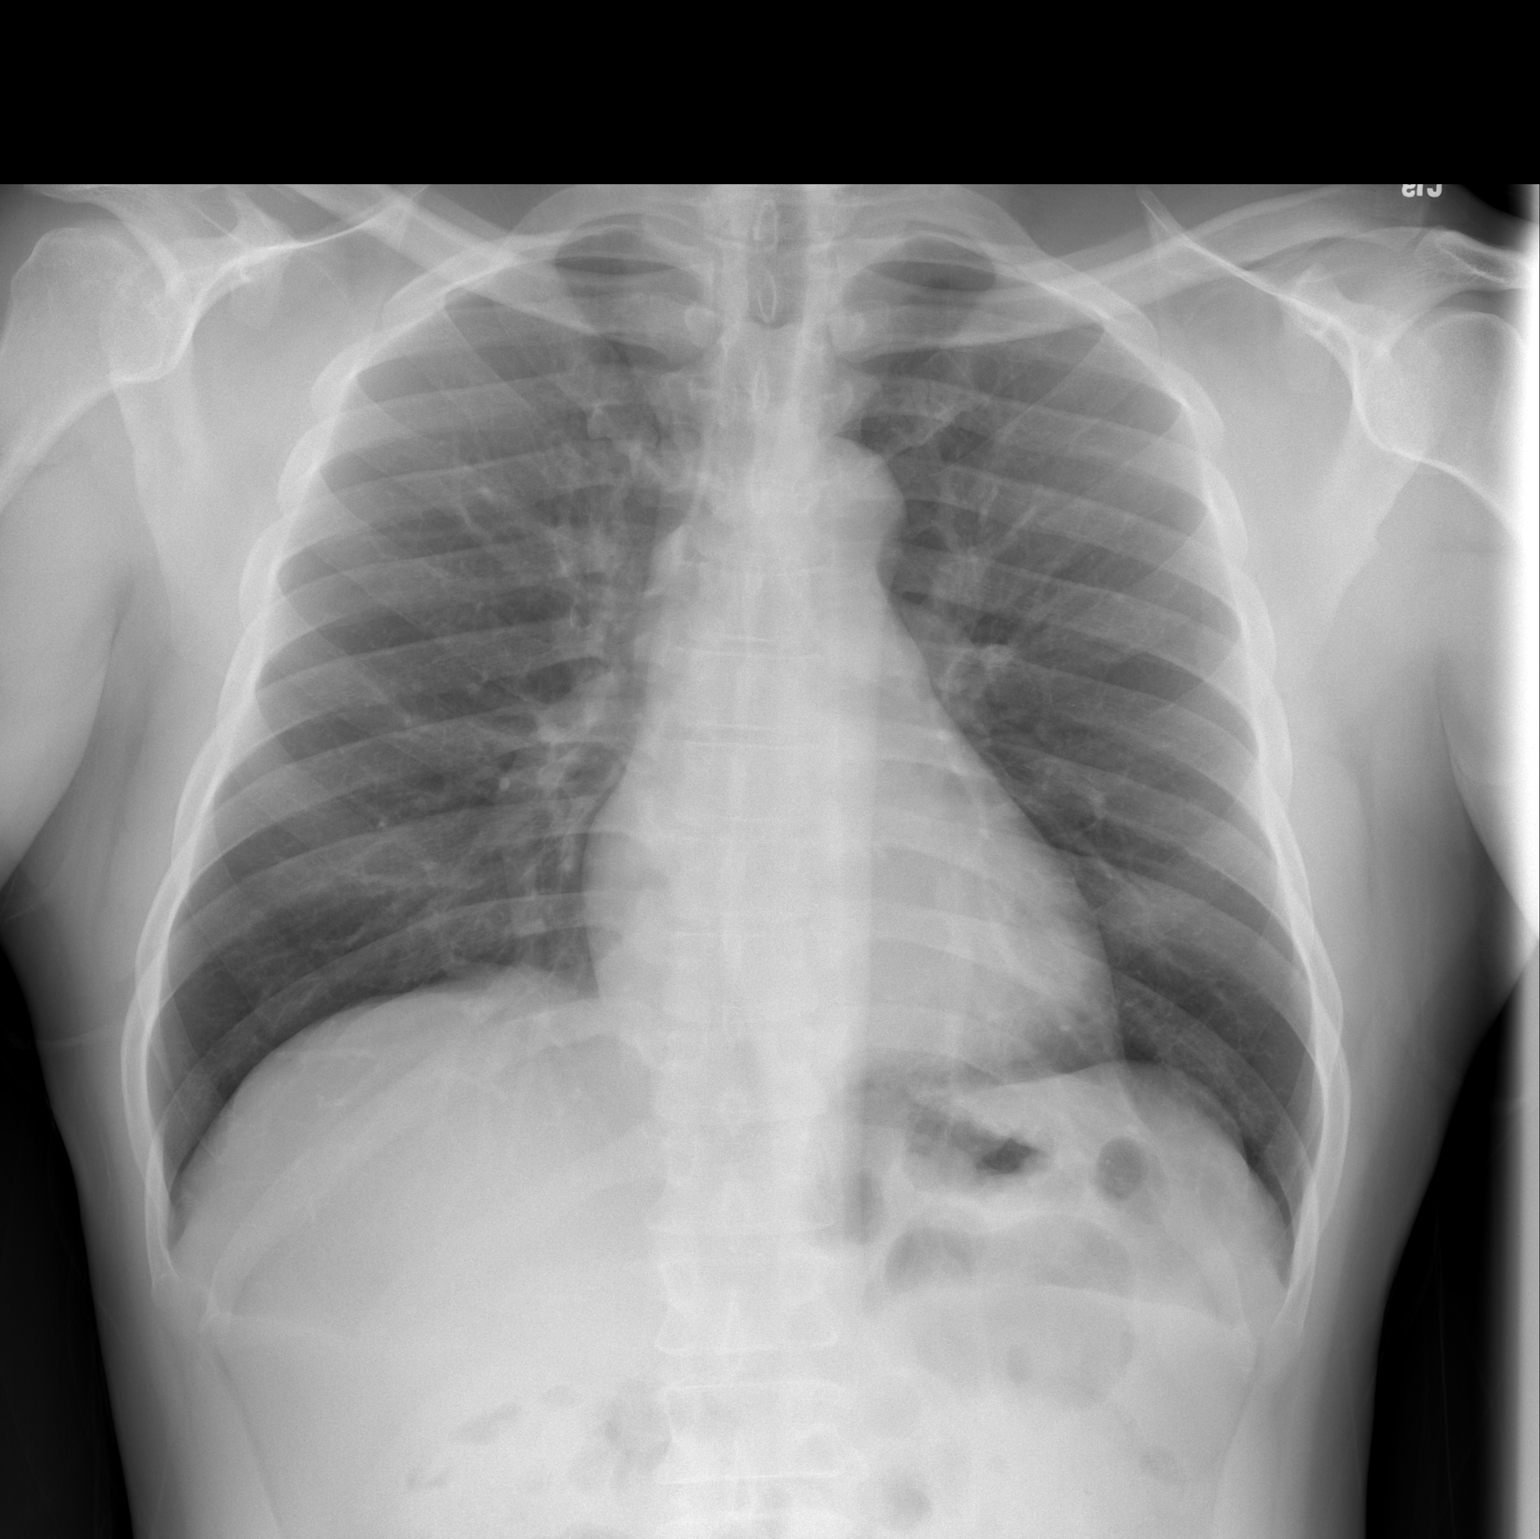

[w chest lat]
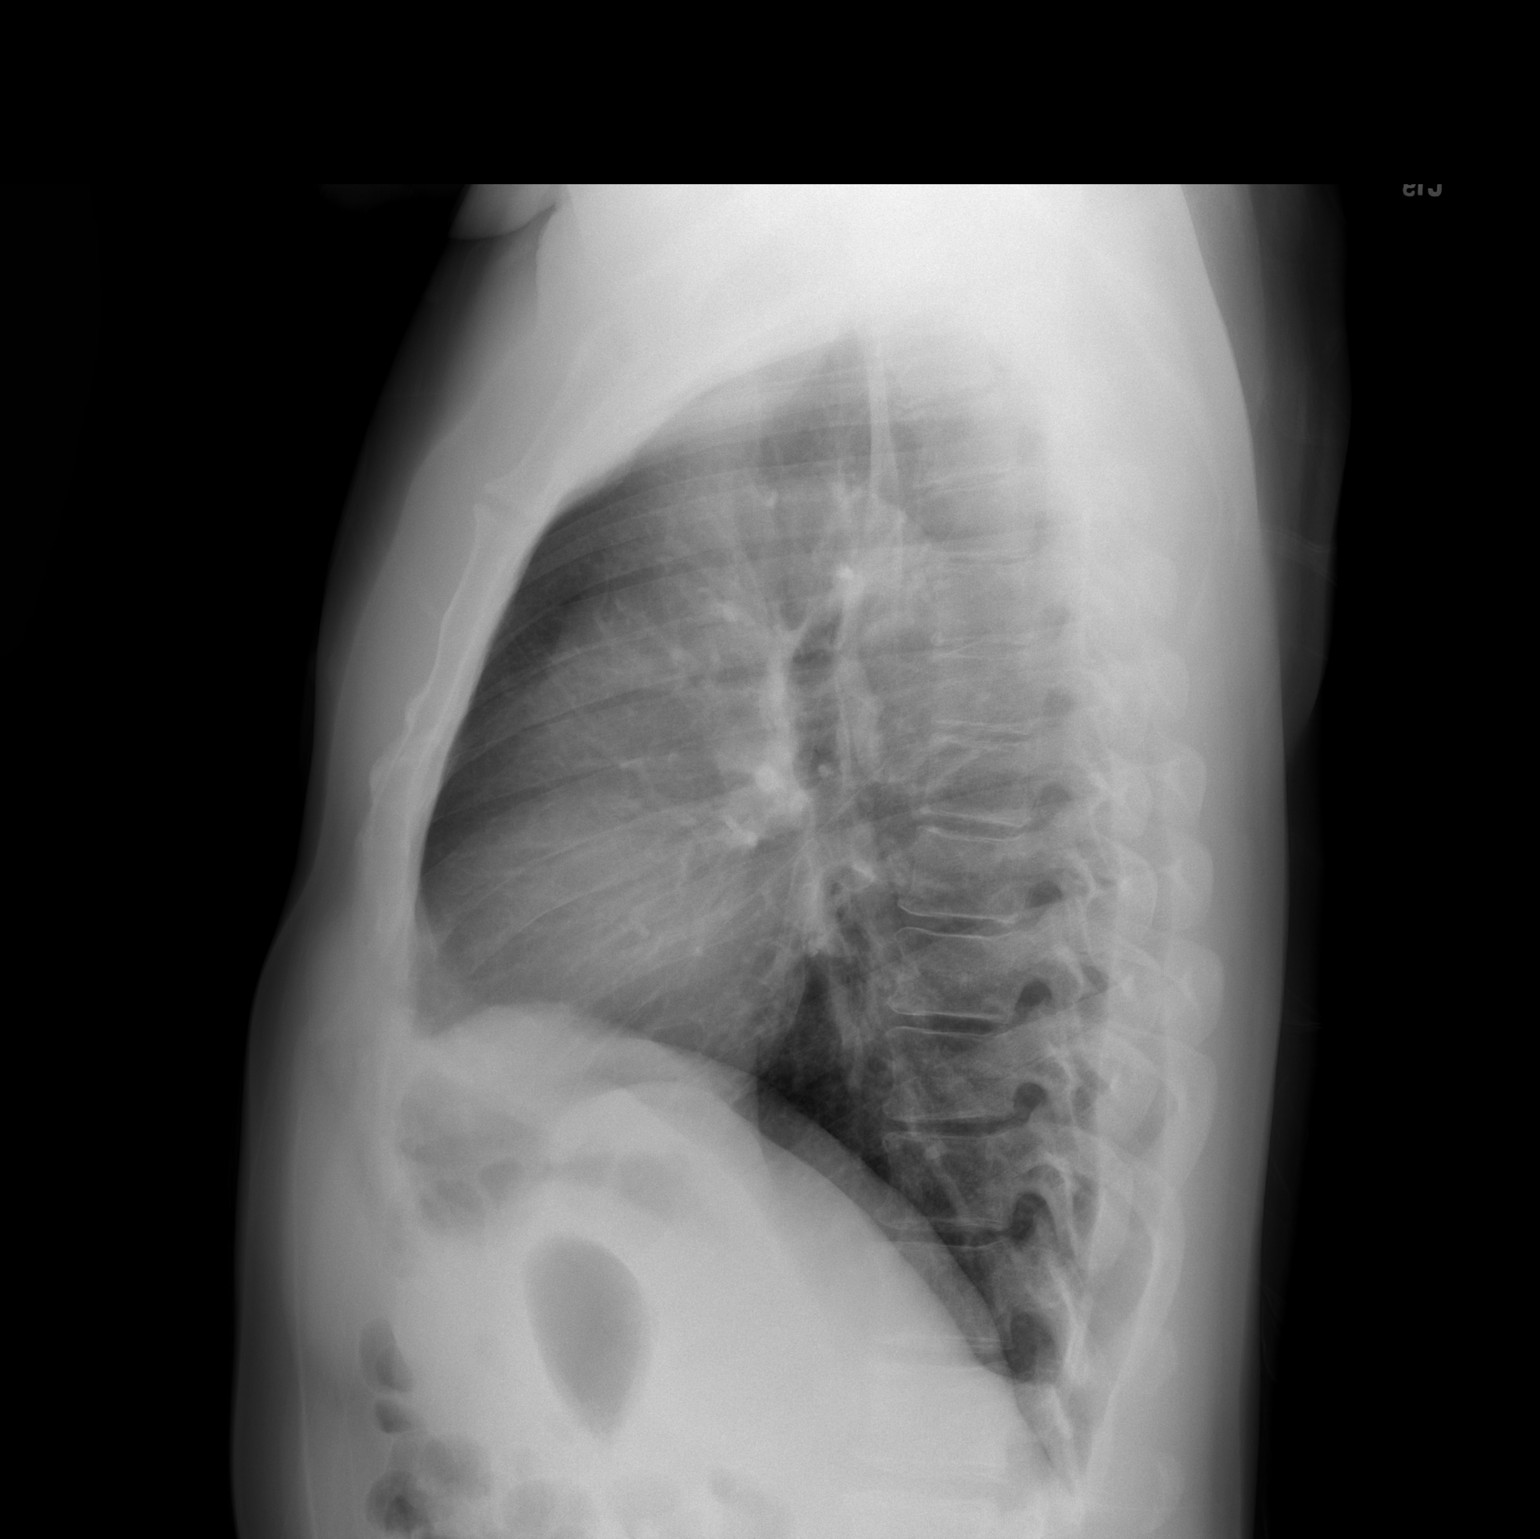

[2 of 2 positions shown; findings below may reference images not displayed]

FINDINGS: The heart size and mediastinal contours are within normal limits.
Both lungs are clear. The visualized skeletal structures are
unremarkable.
IMPRESSION: No active cardiopulmonary disease.

## 2020-12-14 IMAGING — RF OPERATIVE LEFT HIP WITH PELVIS
1 series · 2 of 2 positions shown · non-contrast
Comparison: None.

CLINICAL DATA: Intra op left hip total arthroplasty

EXAM:
OPERATIVE left HIP (WITH PELVIS IF PERFORMED) 2 VIEWS
TECHNIQUE: Fluoroscopic spot image(s) were submitted for interpretation
post-operatively.

[Series 1: run · 2 of 2 slices shown]
[im 1/2]
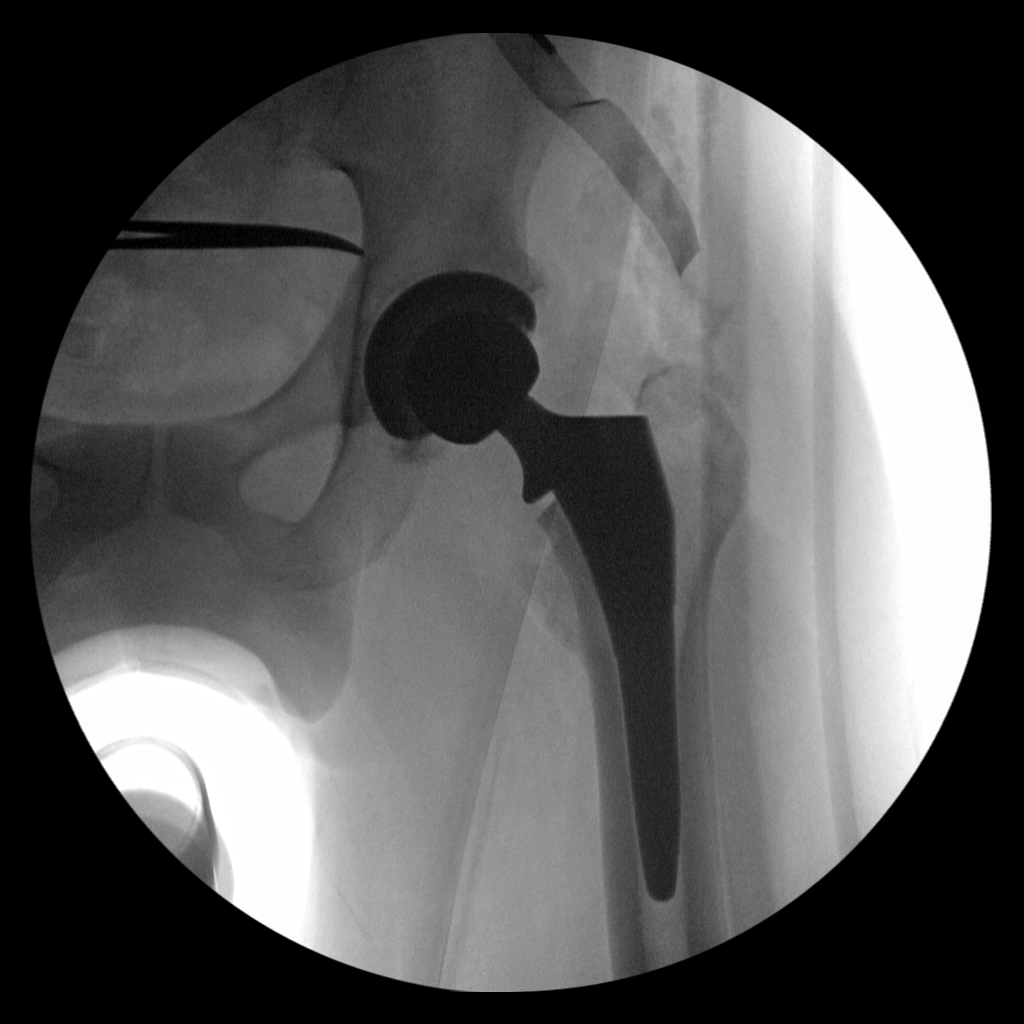
[im 2/2]
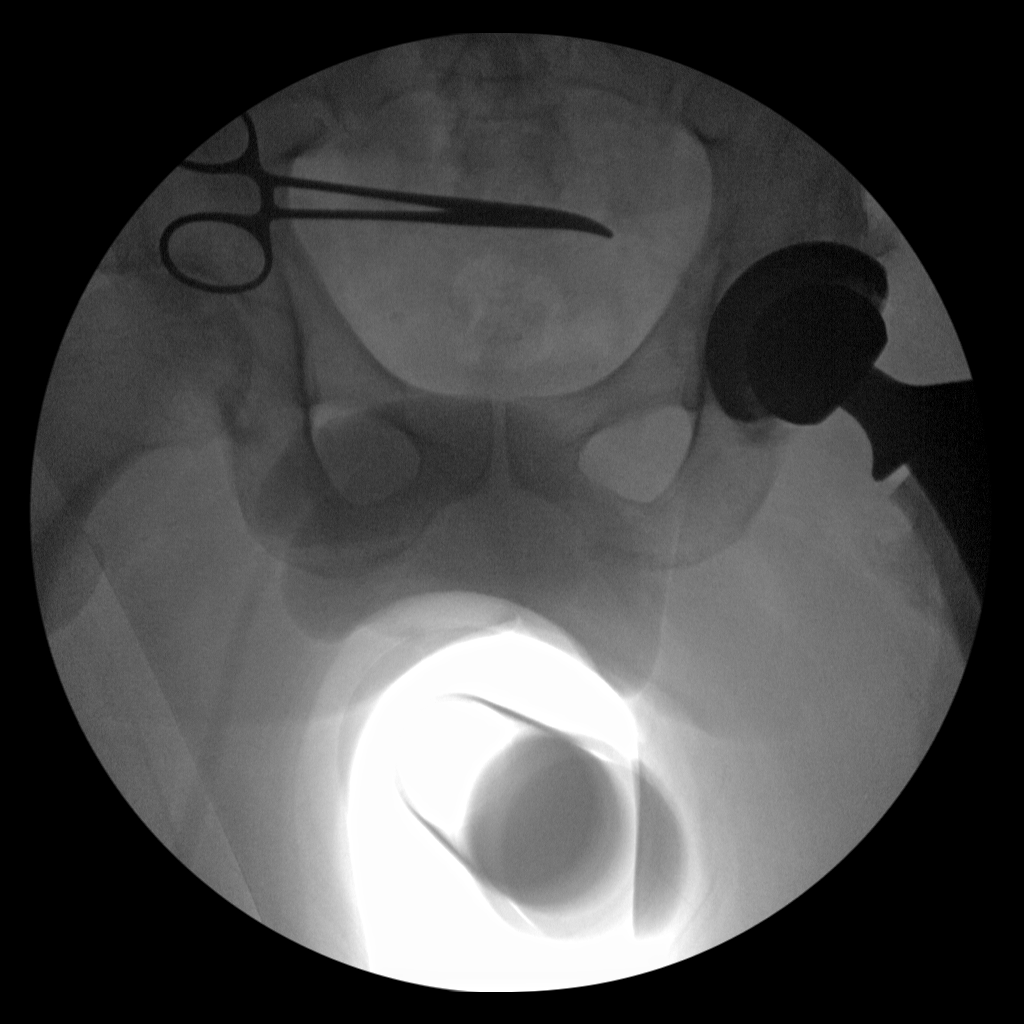

[2 of 2 positions shown; findings below may reference images not displayed]

FINDINGS: Two fluoroscopic images are submitted of a left total hip
arthroplasty. Acetabular and femoral hardware appears to be in
place.
IMPRESSION: Intra op left total hip arthroplasty.

## 2024-11-05 DIAGNOSIS — F1090 Alcohol use, unspecified, uncomplicated: Secondary | ICD-10-CM | POA: Diagnosis not present

## 2024-11-05 DIAGNOSIS — I10 Essential (primary) hypertension: Secondary | ICD-10-CM | POA: Diagnosis not present

## 2024-11-05 DIAGNOSIS — R7303 Prediabetes: Secondary | ICD-10-CM | POA: Diagnosis not present

## 2024-11-05 DIAGNOSIS — Z13220 Encounter for screening for lipoid disorders: Secondary | ICD-10-CM | POA: Diagnosis not present

## 2024-11-05 DIAGNOSIS — Z0000 Encounter for general adult medical examination without abnormal findings: Secondary | ICD-10-CM | POA: Diagnosis not present

## 2024-11-13 DIAGNOSIS — E1165 Type 2 diabetes mellitus with hyperglycemia: Secondary | ICD-10-CM | POA: Diagnosis not present
